# Patient Record
Sex: Male | Born: 1985 | Race: White | Hispanic: No | Smoking: Former smoker
Health system: Southern US, Community
[De-identification: ages and names within clinical notes are randomized; demographics above are authoritative.]

## PROBLEM LIST (undated history)

## (undated) DIAGNOSIS — Z9114 Patient's other noncompliance with medication regimen: Secondary | ICD-10-CM

## (undated) DIAGNOSIS — Z91148 Patient's other noncompliance with medication regimen for other reason: Secondary | ICD-10-CM

## (undated) DIAGNOSIS — R911 Solitary pulmonary nodule: Secondary | ICD-10-CM

## (undated) DIAGNOSIS — N200 Calculus of kidney: Secondary | ICD-10-CM

## (undated) DIAGNOSIS — E669 Obesity, unspecified: Secondary | ICD-10-CM

## (undated) DIAGNOSIS — I10 Essential (primary) hypertension: Secondary | ICD-10-CM

## (undated) DIAGNOSIS — E118 Type 2 diabetes mellitus with unspecified complications: Secondary | ICD-10-CM

## (undated) DIAGNOSIS — E785 Hyperlipidemia, unspecified: Secondary | ICD-10-CM

## (undated) HISTORY — DX: Obesity, unspecified: E66.9

## (undated) HISTORY — DX: Calculus of kidney: N20.0

## (undated) HISTORY — PX: TONSILLECTOMY: SUR1361

## (undated) HISTORY — DX: Hyperlipidemia, unspecified: E78.5

## (undated) HISTORY — DX: Essential (primary) hypertension: I10

## (undated) HISTORY — PX: KIDNEY STONE SURGERY: SHX686

## (undated) HISTORY — DX: Solitary pulmonary nodule: R91.1

---
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
## 2007-10-20 ENCOUNTER — Ambulatory Visit: Payer: Self-pay | Admitting: Pediatrics

## 2011-07-07 LAB — COMPREHENSIVE METABOLIC PANEL
Anion Gap: 12 (ref 7–16)
BUN: 13 mg/dL (ref 7–18)
Bilirubin,Total: 0.3 mg/dL (ref 0.2–1.0)
Calcium, Total: 9.1 mg/dL (ref 8.5–10.1)
Chloride: 106 mmol/L (ref 98–107)
EGFR (African American): >60
Glucose: 148 mg/dL — ABNORMAL HIGH (ref 65–99)
Osmolality: 286 (ref 275–301)
Total Protein: 8.6 g/dL — ABNORMAL HIGH (ref 6.4–8.2)

## 2011-07-07 LAB — DRUG SCREEN, URINE
Barbiturates, Ur Screen: NEGATIVE (ref ?–200)
Benzodiazepine, Ur Scrn: NEGATIVE (ref ?–200)
Cannabinoid 50 Ng, Ur ~~LOC~~: POSITIVE (ref ?–50)
Cocaine Metabolite,Ur ~~LOC~~: NEGATIVE (ref ?–300)
MDMA (Ecstasy)Ur Screen: NEGATIVE (ref ?–500)
Opiate, Ur Screen: NEGATIVE (ref ?–300)
Phencyclidine (PCP) Ur S: NEGATIVE (ref ?–25)

## 2011-07-07 LAB — CBC
HCT: 42.7 % (ref 40.0–52.0)
HGB: 14.2 g/dL (ref 13.0–18.0)
MCH: 28.9 pg (ref 26.0–34.0)
MCHC: 33.2 g/dL (ref 32.0–36.0)
RBC: 4.9 10*6/uL (ref 4.40–5.90)

## 2011-07-07 LAB — ETHANOL: Ethanol %: <0.003 % (ref 0.000–0.080)

## 2011-07-08 ENCOUNTER — Inpatient Hospital Stay: Payer: Self-pay | Admitting: Psychiatry

## 2011-08-20 ENCOUNTER — Emergency Department: Payer: Self-pay | Admitting: *Deleted

## 2011-08-20 LAB — URINALYSIS, COMPLETE
Glucose,UR: NEGATIVE mg/dL (ref 0–75)
Leukocyte Esterase: NEGATIVE
Ph: 6 (ref 4.5–8.0)
RBC,UR: <1 /HPF (ref 0–5)
Specific Gravity: 1.021 (ref 1.003–1.030)
Squamous Epithelial: 1
WBC UR: <1 /HPF (ref 0–5)

## 2011-08-20 LAB — CBC
HGB: 13.2 g/dL (ref 13.0–18.0)
MCV: 87 fL (ref 80–100)
Platelet: 289 10*3/uL (ref 150–440)
RBC: 4.66 10*6/uL (ref 4.40–5.90)
RDW: 14.1 % (ref 11.5–14.5)
WBC: 12.4 10*3/uL — ABNORMAL HIGH (ref 3.8–10.6)

## 2011-08-20 LAB — COMPREHENSIVE METABOLIC PANEL
Albumin: 4.1 g/dL (ref 3.4–5.0)
BUN: 13 mg/dL (ref 7–18)
Bilirubin,Total: 0.7 mg/dL (ref 0.2–1.0)
Calcium, Total: 9.2 mg/dL (ref 8.5–10.1)
Chloride: 104 mmol/L (ref 98–107)
Creatinine: 1.16 mg/dL (ref 0.60–1.30)
Glucose: 138 mg/dL — ABNORMAL HIGH (ref 65–99)
Osmolality: 280 (ref 275–301)
Potassium: 4 mmol/L (ref 3.5–5.1)
SGOT(AST): 23 U/L (ref 15–37)

## 2012-09-21 ENCOUNTER — Emergency Department: Payer: Self-pay | Admitting: Emergency Medicine

## 2012-09-21 LAB — COMPREHENSIVE METABOLIC PANEL
Albumin: 4 g/dL (ref 3.4–5.0)
Alkaline Phosphatase: 135 U/L (ref 50–136)
Anion Gap: 5 — ABNORMAL LOW (ref 7–16)
BUN: 11 mg/dL (ref 7–18)
Bilirubin,Total: 0.6 mg/dL (ref 0.2–1.0)
Creatinine: 1.4 mg/dL — ABNORMAL HIGH (ref 0.60–1.30)
EGFR (African American): >60
EGFR (Non-African Amer.): >60
Glucose: 124 mg/dL — ABNORMAL HIGH (ref 65–99)
Potassium: 3.4 mmol/L — ABNORMAL LOW (ref 3.5–5.1)
SGOT(AST): 39 U/L — ABNORMAL HIGH (ref 15–37)
SGPT (ALT): 68 U/L (ref 12–78)
Total Protein: 7.8 g/dL (ref 6.4–8.2)

## 2012-09-21 LAB — CBC
HCT: 40.4 % (ref 40.0–52.0)
HGB: 13.5 g/dL (ref 13.0–18.0)
MCH: 28.7 pg (ref 26.0–34.0)
MCHC: 33.5 g/dL (ref 32.0–36.0)
Platelet: 331 10*3/uL (ref 150–440)
RBC: 4.72 10*6/uL (ref 4.40–5.90)
RDW: 13.2 % (ref 11.5–14.5)
WBC: 12 10*3/uL — ABNORMAL HIGH (ref 3.8–10.6)

## 2013-02-05 ENCOUNTER — Observation Stay: Payer: Self-pay | Admitting: Internal Medicine

## 2013-02-05 LAB — URINALYSIS, COMPLETE
Bacteria: NONE SEEN
Hyaline Cast: 2
Leukocyte Esterase: NEGATIVE
Ph: 5 (ref 4.5–8.0)
RBC,UR: 1 /HPF (ref 0–5)
Specific Gravity: 1.025 (ref 1.003–1.030)

## 2013-02-05 LAB — BASIC METABOLIC PANEL
Calcium, Total: 9.7 mg/dL (ref 8.5–10.1)
Co2: 23 mmol/L (ref 21–32)
EGFR (African American): >60
EGFR (Non-African Amer.): >60
Sodium: 139 mmol/L (ref 136–145)

## 2013-02-05 LAB — CBC
HGB: 15 g/dL (ref 13.0–18.0)
MCH: 28.9 pg (ref 26.0–34.0)
MCHC: 33.7 g/dL (ref 32.0–36.0)
RDW: 13.3 % (ref 11.5–14.5)
WBC: 18.9 10*3/uL — ABNORMAL HIGH (ref 3.8–10.6)

## 2013-02-05 LAB — HEMOGLOBIN A1C: Hemoglobin A1C: 7.4 % — ABNORMAL HIGH (ref 4.2–6.3)

## 2013-02-05 LAB — DRUG SCREEN, URINE
Amphetamines, Ur Screen: NEGATIVE (ref ?–1000)
Barbiturates, Ur Screen: NEGATIVE (ref ?–200)
Benzodiazepine, Ur Scrn: NEGATIVE (ref ?–200)
Cocaine Metabolite,Ur ~~LOC~~: NEGATIVE (ref ?–300)
MDMA (Ecstasy)Ur Screen: NEGATIVE (ref ?–500)
Methadone, Ur Screen: NEGATIVE (ref ?–300)
Opiate, Ur Screen: NEGATIVE (ref ?–300)
Tricyclic, Ur Screen: NEGATIVE (ref ?–1000)

## 2013-02-06 LAB — BASIC METABOLIC PANEL
Anion Gap: 6 — ABNORMAL LOW (ref 7–16)
BUN: 16 mg/dL (ref 7–18)
Calcium, Total: 8.8 mg/dL (ref 8.5–10.1)
Co2: 27 mmol/L (ref 21–32)
Creatinine: 1.27 mg/dL (ref 0.60–1.30)
EGFR (African American): >60
EGFR (Non-African Amer.): >60
Glucose: 119 mg/dL — ABNORMAL HIGH (ref 65–99)
Osmolality: 282 (ref 275–301)
Sodium: 140 mmol/L (ref 136–145)

## 2013-02-06 LAB — CK TOTAL AND CKMB (NOT AT ARMC)
CK, Total: 302 U/L — ABNORMAL HIGH (ref 35–232)
CK, Total: 326 U/L — ABNORMAL HIGH (ref 35–232)
CK-MB: 2 ng/mL (ref 0.5–3.6)

## 2013-02-06 LAB — TROPONIN I: Troponin-I: 0.06 ng/mL — ABNORMAL HIGH

## 2013-02-06 LAB — LIPID PANEL
Cholesterol: 144 mg/dL (ref 0–200)
Ldl Cholesterol, Calc: 86 mg/dL (ref 0–100)
Triglycerides: 120 mg/dL (ref 0–200)
VLDL Cholesterol, Calc: 24 mg/dL (ref 5–40)

## 2013-05-12 ENCOUNTER — Emergency Department: Payer: Self-pay | Admitting: Emergency Medicine

## 2013-05-12 LAB — COMPREHENSIVE METABOLIC PANEL
ALBUMIN: 4.1 g/dL (ref 3.4–5.0)
ALT: 50 U/L (ref 12–78)
ANION GAP: 5 — AB (ref 7–16)
Alkaline Phosphatase: 122 U/L — ABNORMAL HIGH
BUN: 14 mg/dL (ref 7–18)
Bilirubin,Total: 0.8 mg/dL (ref 0.2–1.0)
CALCIUM: 9.3 mg/dL (ref 8.5–10.1)
CO2: 27 mmol/L (ref 21–32)
CREATININE: 1.27 mg/dL (ref 0.60–1.30)
Chloride: 103 mmol/L (ref 98–107)
Glucose: 174 mg/dL — ABNORMAL HIGH (ref 65–99)
OSMOLALITY: 275 (ref 275–301)
Potassium: 3.9 mmol/L (ref 3.5–5.1)
SGOT(AST): 32 U/L (ref 15–37)
SODIUM: 135 mmol/L — AB (ref 136–145)
TOTAL PROTEIN: 8.3 g/dL — AB (ref 6.4–8.2)

## 2013-05-12 LAB — CBC WITH DIFFERENTIAL/PLATELET
BASOS ABS: 0.1 10*3/uL (ref 0.0–0.1)
Basophil %: 0.9 %
EOS PCT: 1.2 %
Eosinophil #: 0.1 10*3/uL (ref 0.0–0.7)
HCT: 44.3 % (ref 40.0–52.0)
HGB: 14.6 g/dL (ref 13.0–18.0)
LYMPHS ABS: 2.3 10*3/uL (ref 1.0–3.6)
LYMPHS PCT: 18.7 %
MCH: 28.9 pg (ref 26.0–34.0)
MCHC: 32.9 g/dL (ref 32.0–36.0)
MCV: 88 fL (ref 80–100)
MONO ABS: 0.8 x10 3/mm (ref 0.2–1.0)
Monocyte %: 6.2 %
NEUTROS PCT: 73 %
Neutrophil #: 9 10*3/uL — ABNORMAL HIGH (ref 1.4–6.5)
Platelet: 307 10*3/uL (ref 150–440)
RBC: 5.03 10*6/uL (ref 4.40–5.90)
RDW: 13.9 % (ref 11.5–14.5)
WBC: 12.3 10*3/uL — ABNORMAL HIGH (ref 3.8–10.6)

## 2013-05-12 LAB — PROTIME-INR
INR: 1
Prothrombin Time: 12.6 secs (ref 11.5–14.7)

## 2013-05-20 ENCOUNTER — Emergency Department: Payer: Self-pay | Admitting: Emergency Medicine

## 2013-05-20 LAB — TROPONIN I
Troponin-I: <0.02 ng/mL
Troponin-I: <0.02 ng/mL

## 2013-05-20 LAB — BASIC METABOLIC PANEL
ANION GAP: 6 — AB (ref 7–16)
BUN: 12 mg/dL (ref 7–18)
CO2: 25 mmol/L (ref 21–32)
Calcium, Total: 8.7 mg/dL (ref 8.5–10.1)
Chloride: 106 mmol/L (ref 98–107)
Creatinine: 1.3 mg/dL (ref 0.60–1.30)
EGFR (African American): >60
Glucose: 106 mg/dL — ABNORMAL HIGH (ref 65–99)
OSMOLALITY: 274 (ref 275–301)
POTASSIUM: 3.7 mmol/L (ref 3.5–5.1)
Sodium: 137 mmol/L (ref 136–145)

## 2013-05-20 LAB — CBC
HCT: 42 % (ref 40.0–52.0)
HGB: 13.8 g/dL (ref 13.0–18.0)
MCH: 29.2 pg (ref 26.0–34.0)
MCHC: 32.8 g/dL (ref 32.0–36.0)
MCV: 89 fL (ref 80–100)
Platelet: 290 10*3/uL (ref 150–440)
RBC: 4.72 10*6/uL (ref 4.40–5.90)
RDW: 13.8 % (ref 11.5–14.5)
WBC: 13.2 10*3/uL — ABNORMAL HIGH (ref 3.8–10.6)

## 2013-05-22 ENCOUNTER — Encounter: Payer: Self-pay | Admitting: *Deleted

## 2013-05-23 ENCOUNTER — Encounter: Payer: Self-pay | Admitting: Cardiovascular Disease

## 2013-05-23 ENCOUNTER — Ambulatory Visit (INDEPENDENT_AMBULATORY_CARE_PROVIDER_SITE_OTHER): Payer: Self-pay | Admitting: Cardiovascular Disease

## 2013-05-23 VITALS — BP 168/112 | HR 102 | Ht 70.0 in | Wt 316.2 lb

## 2013-05-23 VITALS — BP 154/112 | HR 93 | Ht 70.0 in | Wt 316.2 lb

## 2013-05-23 DIAGNOSIS — R0602 Shortness of breath: Secondary | ICD-10-CM

## 2013-05-23 DIAGNOSIS — I10 Essential (primary) hypertension: Secondary | ICD-10-CM | POA: Insufficient documentation

## 2013-05-23 DIAGNOSIS — R079 Chest pain, unspecified: Secondary | ICD-10-CM

## 2013-05-23 MED ORDER — CARVEDILOL 12.5 MG PO TABS: 12.5000 mg | ORAL_TABLET | Freq: Two times a day (BID) | ORAL | Status: DC

## 2013-05-23 MED ORDER — LISINOPRIL-HYDROCHLOROTHIAZIDE 20-25 MG PO TABS
1.0000 | ORAL_TABLET | Freq: Two times a day (BID) | ORAL | Status: DC
Start: 2013-05-23 — End: 2014-12-04

## 2013-05-23 NOTE — Progress Notes (Signed)
HPI  This is a 28 year old Caucasian male who was referred from the emergency room at [facility] for evaluation of chest pain and dyspnea. He has no previous cardiac history. He has known history of uncontrolled hypertension since he was in high school, obesity, recent diagnosis of diabetes, previous tobacco use and suspected sleep apnea. He was hospitalized briefly at [facility] in November of 2014 with chest pain and shortness of breath. He was very hypertensive with a blood pressure of 215/127. He also reported hemoptysis. He underwent CTA of the chest which showed no evidence of pulmonary embolism. There was incidental finding of a 5 mm right lung nodule. Cardiac enzymes were negative. He was placed on antihypertensive medications and discharged home. He went back to the emergency room recently for chest pain and thus he was asked to followup with [place]. He reports chest discomfort both at rest and occasionally with physical activities. It's described as sharp pain and occasionally tightness. He gets extreme dyspnea with activities but he does not exercise on a regular basis. He reports presyncopal episodes with palpitations after having sex. He quit smoking 2 months ago. He snores loudly at night and occasionally wakes up in the middle of the night with difficulty breathing. 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 He has not been able to afford  health insurance. There is family history of hypertension and congestive heart failure but not premature coronary artery disease.   No Known Allergies   No current outpatient prescriptions on file prior to visit.   No current facility-administered medications on file prior to visit.     Past Medical History  Diagnosis Date  . Obesity   . Kidney stones   . Diabetes mellitus without complication   . Hyperlipidemia   . Lung nodule   . Hypertension      Past Surgical History  Procedure Laterality Date  . Kidney stone surgery       Family  History  Problem Relation Age of Onset  . Heart failure Mother   . Hypertension Mother   . Brain cancer Father      History   Social History  . Marital Status: Legally Separated    Spouse Name: N/A    Number of Children: N/A  . Years of Education: N/A   Occupational History  . Not on file.   Social History Main Topics  . Smoking status: Former Smoker -- 0.50 packs/day for 1 years    Types: Cigarettes  . Smokeless tobacco: Not on file  . Alcohol Use: Yes     Comment: occasional  . Drug Use: Yes    Special: Marijuana  . Sexual Activity: Not on file   Other Topics Concern  . Not on file   Social History Narrative  . No narrative on file     ROS A 10 point review of system was performed. It is negative other than that mentioned in the history of present illness.   PHYSICAL EXAM   BP 154/112  Pulse 93  Ht 5\' 10"  (1.778 m)  Wt 316 lb 4 oz (143.45 kg)  BMI 45.38 kg/m2 Constitutional: He is oriented to person, place, and time. He appears well-developed and well-nourished. No distress.  HENT: No nasal discharge.  Head: Normocephalic and atraumatic.  Eyes: Pupils are equal and round.  No discharge. Neck: Normal range of motion. Neck supple. No JVD present. No thyromegaly present.  Cardiovascular: Normal rate, regular rhythm, normal heart sounds. Exam reveals  no gallop and no friction rub. No murmur heard.  Pulmonary/Chest: Effort normal and breath sounds normal. No stridor. No respiratory distress. He has no wheezes. He has no rales. He exhibits no tenderness.  Abdominal: Soft. Bowel sounds are normal. He exhibits no distension. There is no tenderness. There is no rebound and no guarding.  Musculoskeletal: Normal range of motion. He exhibits no edema and no tenderness.  Neurological: He is alert and oriented to person, place, and time. Coordination normal.  Skin: Skin is warm and dry. No rash noted. He is not diaphoretic. No erythema. No pallor.  Psychiatric: He has  a normal mood and affect. His behavior is normal. Judgment and thought content normal.       WUX:LKGMWEKG:Sinus  Rhythm  WITHIN NORMAL LIMITS   ASSESSMENT AND PLAN

## 2013-05-23 NOTE — Patient Instructions (Signed)
Your stress test was normal.   Make the following changes to your medications: 1. Stop Hydralazine.  2. Start Carvedilol 12.5 mg twice daily.  3. Change Lisinopril-HCTZ to 20-12.5 mg twice daily.   You need to establish with a family doctor.   Follow up in 2 months.

## 2013-05-23 NOTE — Procedures (Signed)
   Treadmill Stress test  Indication: Chest pain and dyspnea.  Baseline Data:  Resting EKG shows NSR with rate of 102 bpm, nonspecific T wave changes Resting blood pressure of 168/112 mm Hg Stand bruce protocal was used.  Exercise Data:  Patient exercised for 5 min 29 sec,  Peak heart rate of 163 bpm.  This was 84 % of the maximum predicted heart rate. The patient had right-sided chest pain and significant dyspnea with exercise. This resolved in recovery. Peak Blood pressure recorded was 240/104 Maximal work level: 7 METs.  Heart rate at 3 minutes in recovery was 108 bpm. BP response: Hypertensive HR response: Normal  EKG with Exercise: Sinus tachycardia with no significant ST changes  FINAL IMPRESSION: Normal exercise stress test. No significant EKG changes concerning for ischemia. Poor exercise tolerance for age with hypertensive response to exercise.  Recommendation: Aggressive lifestyle changes, exercise program, blood pressure control and weight loss.

## 2013-05-23 NOTE — Assessment & Plan Note (Signed)
This is likely multifactorial due to physical deconditioning, uncontrolled hypertension, diastolic dysfunction and possible underlying sleep apnea. We will work on blood pressure control. I advised him to establish with a primary care physician. I discussed with him the importance of regular exercise, healthy diet and weight loss. An echocardiogram can be considered if symptoms do not improve.

## 2013-05-23 NOTE — Assessment & Plan Note (Signed)
The chest pain is somewhat atypical but he has associated exertional dyspnea. His risk factors for coronary artery disease include hypertension, diabetes and obesity. Given his young age, the chance of obstructive coronary artery disease is low. However, given his recurrent presentation with chest pain, I decided to proceed with a treadmill stress test. He was able to exercise for only 5-1/2 minutes. He had hypertensive response to exercise. There was no significant ST changes. I recommend aggressive lifestyle changes in blood pressure control. This was discussed extensively with.

## 2013-05-23 NOTE — Patient Instructions (Signed)
Your physician has requested that you have an exercise tolerance test. For further information please visit [email or web address]. Please also follow instruction sheet, as given.   

## 2013-05-23 NOTE — Assessment & Plan Note (Signed)
Blood pressure is still not controlled. Given his complaints of palpitations, I discontinued hydralazine which can cause reflexive tachycardia and switch him to carvedilol instead. I also increased the dose of lisinopril-hydrochlorothiazide to twice daily. Check basic metabolic profile in one week.

## 2013-05-24 ENCOUNTER — Ambulatory Visit: Payer: Self-pay | Admitting: Oncology

## 2013-05-24 ENCOUNTER — Telehealth: Payer: Self-pay | Admitting: *Deleted

## 2013-05-24 DIAGNOSIS — I10 Essential (primary) hypertension: Secondary | ICD-10-CM

## 2013-05-24 NOTE — Telephone Encounter (Signed)
Message copied by Fransico SettersBAUCOM, Nirali Magouirk E on Fri May 24, 2013  8:21 AM ------      Message from: Lorine BearsARIDA, MUHAMMAD A      Created: Thu May 23, 2013  6:36 PM       Order basic metabolic profile in one week given that we increased the dose of lisinopril-hydrochlorothiazide. Thanks ------

## 2013-05-30 ENCOUNTER — Ambulatory Visit (INDEPENDENT_AMBULATORY_CARE_PROVIDER_SITE_OTHER): Payer: Self-pay | Admitting: *Deleted

## 2013-05-30 DIAGNOSIS — I10 Essential (primary) hypertension: Secondary | ICD-10-CM

## 2013-05-31 LAB — BASIC METABOLIC PANEL
BUN/Creatinine Ratio: 10 (ref 8–19)
BUN: 14 mg/dL (ref 6–20)
CALCIUM: 9.4 mg/dL (ref 8.7–10.2)
CHLORIDE: 100 mmol/L (ref 97–108)
CO2: 20 mmol/L (ref 18–29)
Creatinine, Ser: 1.35 mg/dL — ABNORMAL HIGH (ref 0.76–1.27)
GFR calc non Af Amer: 72 mL/min/{1.73_m2} (ref >59)
GFR, EST AFRICAN AMERICAN: 83 mL/min/{1.73_m2} (ref >59)
Glucose: 177 mg/dL — ABNORMAL HIGH (ref 65–99)
Potassium: 4.4 mmol/L (ref 3.5–5.2)
Sodium: 140 mmol/L (ref 134–144)

## 2013-06-12 ENCOUNTER — Ambulatory Visit: Payer: Self-pay | Admitting: Oncology

## 2013-07-23 ENCOUNTER — Encounter: Payer: Self-pay | Admitting: *Deleted

## 2013-07-23 ENCOUNTER — Ambulatory Visit: Payer: Self-pay | Admitting: Cardiovascular Disease

## 2014-04-10 ENCOUNTER — Emergency Department: Payer: Self-pay | Admitting: Emergency Medicine

## 2014-04-10 LAB — BASIC METABOLIC PANEL
Anion Gap: 6 — ABNORMAL LOW (ref 7–16)
BUN: 14 mg/dL (ref 7–18)
Calcium, Total: 9.4 mg/dL (ref 8.5–10.1)
Chloride: 105 mmol/L (ref 98–107)
Co2: 26 mmol/L (ref 21–32)
Creatinine: 1.4 mg/dL — ABNORMAL HIGH (ref 0.60–1.30)
EGFR (African American): >60
EGFR (Non-African Amer.): >60
Glucose: 151 mg/dL — ABNORMAL HIGH (ref 65–99)
Osmolality: 277 (ref 275–301)
Potassium: 3.8 mmol/L (ref 3.5–5.1)
Sodium: 137 mmol/L (ref 136–145)

## 2014-04-10 LAB — TROPONIN I

## 2014-07-04 NOTE — H&P (Signed)
PATIENT NAME:  Carlos [name], Carlos [name] MR#:  [id number] DATE OF BIRTH:  March 29, 1985  DATE OF ADMISSION:  02/05/2013  REFERRING PHYSICIAN:  Dr. Scotty CourtStafford.   PRIMARY CARE PHYSICIAN:  None.   CHIEF COMPLAINT:  Chest pain.   HISTORY OF PRESENT ILLNESS:  A 29 year old Caucasian gentleman with past medical history of hypertension, noncompliant with medications, presenting with chest pain.  The chest pain began acutely today, retrosternal in nature, sharp, 10 out of 10 in intensity, radiating to the left shoulder and neck associated with shortness of breath, nausea and diaphoresis.  Symptoms began approximately at 6:00 p.m., relieved in the Emergency Department with nitroglycerin, now states that his chest pain is 1 to 2 out of 10 in intensity and rates the quality as soreness.  On arrival, he was markedly hypertensive at 215/127 which has now trended down after receiving three doses of nitroglycerin to 166/94.  Of note, he also complains of having a cough which is nonproductive for approximately one week in duration.  He now states that he thinks that he has some spots of blood when he is coughing.   REVIEW OF SYSTEMS:   Currently, complaining of mild chest soreness as described above.  Otherwise no further complaints.   CONSTITUTIONAL:  Denies fever, fatigue, weakness, pain.  EYES:  Blurred vision, double vision, eye pain.  EARS, NOSE, THROAT:  Denies tinnitus, ear pain, hearing loss.  RESPIRATORY:  Positive for cough as described above as well as questionable hemoptysis.  Denies any wheeze, shortness of breath.  CARDIOVASCULAR:  Positive for chest pain as described above.  Denies any orthopnea, edema or palpitations.  GASTROINTESTINAL:  Denies nausea, vomiting, diarrhea, abdominal pain.  GENITOURINARY:  No dysuria, hematuria.  ENDOCRINE:  Denies nocturia or polyuria.  HEMATOLOGIC AND LYMPHATIC:  Denies easy bruising or bleeding.  SKIN:  Denies any rash or lesions.  MUSCULOSKELETAL:  Denies pain in neck,  back, shoulder, knees, hips, any arthritic symptoms.  NEUROLOGIC:  Denies any paralysis, paresthesias.  PSYCHIATRIC:  Denies anxiety or depressive symptoms.  Otherwise, full review of systems performed by me is negative.   PAST MEDICAL HISTORY:  Hypertension and obesity.  He is on no medications.   FAMILY HISTORY:  Positive for diabetes, hypertension, coronary artery disease.   SOCIAL HISTORY:  Recent tobacco usage.  He states that he quit about one week ago.  Occasional alcohol usage.  Denies any drug usage.   ALLERGIES:  No known drug allergies.   HOME MEDICATIONS:  None.   PHYSICAL EXAMINATION: VITAL SIGNS:  Temperature 98.4, heart rate 115, respirations 26, blood pressure on arrival 215/127, saturating 97% on room air.  Current blood pressure 166/94, heart rate of 96.  Weight 136.1 kg.  GENERAL:  Well-nourished, well-developed, obese Caucasian gentleman.  No acute distress.  HEAD:  Normocephalic, atraumatic.  EYES:  Pupils equal, round, and reactive to light.  Extraocular muscles intact.  No scleral icterus.  MOUTH:  Moist mucosal membranes.  Dentition intact.  No abscess noted.   EARS, NOSE, THROAT:  Throat clear without exudates.  No external lesions.  NECK:  Supple.  No thyromegaly.  No nodules.  No JVD.  PULMONARY:  Clear to auscultation bilaterally.  No wheezes, rales or rhonchi.  Somewhat diminished breath sounds at bases secondary to body habitus.  No use of accessory muscles.  Good respiratory effort.  Chest mildly tender to palpation over sternum.  CARDIOVASCULAR:  S1, S2, regular rate and rhythm.  No murmurs, rubs or gallops.  No edema.  Pedal pulses 2+ bilaterally.   GASTROINTESTINAL:  Soft, nontender, nondistended.  No masses.  Positive bowel sounds.  No hepatosplenomegaly.  Obese.  MUSCULOSKELETAL:  No swelling, clubbing or edema.  Range of motion full in all extremities.  NEUROLOGIC:  Cranial nerves II through XII intact.  No gross focal neurological deficits.  Sensation  intact.  Reflexes intact.  SKIN:  No ulcerations, lesions, rashes or cyanosis.  Skin warm, dry.  Turgor intact.  PSYCHIATRIC:  Mood and affect within normal limits.  The patient awake, alert, oriented x 3.  Insight and judgment intact.   LABORATORY DATA:  Sodium 139, potassium 3.8, chloride 105, bicarb 23, BUN 19, creatinine 1.47, glucose 148.  Troponin I 0.08.  WBC 18.9, hemoglobin 15, platelets 327.   Chest x-ray, no acute cardiopulmonary process, shallow lung volumes, somewhat obscured by body habitus.  EKG reveals sinus tachycardia at 115.  No evidence of ST or T wave abnormalities.   ASSESSMENT AND PLAN:  A 29 year old Caucasian gentleman with history of hypertension who is noncompliant with medications, presenting with acute onset of chest pain with associated shortness of breath, nausea, diaphoresis, also stating he has been having hemoptysis for approximately one week in total.    1.  Chest pain described as typical pattern, though extremely unlikely to have coronary artery disease at this age.  We will check a urine drug screen as cocaine may likely play a role.  He has been given aspirin, nitroglycerin with relief of symptoms.  We will admit to observation.  Trend cardiac enzymes.  if trending up will start a heparin drip.  2.  Hemoptysis.  No episodes in the Emergency Department.  No indication for transfusion.  His Well's score is 1, meaning low probability for pulmonary embolus.  We will check a [name]-dimer.  3.  Hyperglycemia.  We will add insulin sliding scale and Accu-Cheks q. 6 hours.  We will check hemoglobin A1c as he may have underlying diabetes.  4.  Acute kidney injury.  IV fluids hydration with normal saline.  5.  Leukocytosis with no evidence of infection.  We will hold off on antibiotics.  6.  DVT prophylaxis with heparin subQ.  7.  CODE STATUS:  THE PATIENT IS A FULL CODE.   TIME SPENT:  45 minutes.    ____________________________ Cletis Athens. Hower, MD dkh:ea [name]: 02/05/2013  22:46:25 ET T: 02/05/2013 23:01:58 ET JOB#: [id number]  cc: Cletis Athens. Hower, MD, <Dictator> DAVID Synetta Shadow MD ELECTRONICALLY SIGNED 02/05/2013 23:26

## 2014-07-04 NOTE — Discharge Summary (Signed)
PATIENT NAME:  Carlos ShepherdJEFFREY, Anish [name] MR#:  [id number] DATE OF BIRTH:  1985/12/29  DATE OF ADMISSION:  02/05/2013 DATE OF DISCHARGE:  02/06/2013  DISCHARGE DIAGNOSES:  1. Accelerated hypertension.  2. Rhabdomyolysis with minimal elevation of troponin.  3. New diagnosis of diabetes mellitus type 2.  4. Tobacco abuse.  5. Obesity.  6. Noncompliance.   IMAGING STUDIES DONE: Include chest x-ray, portable, which showed no acute disease.   ADMITTING HISTORY AND PHYSICAL: Please see detailed H and P dictated by Dr. Clint GuyHower. In brief, a 29 year old male patient with history of hypertension, who stopped taking medications 3 months prior, who presented to the hospital with some cough, shortness of breath and chest pain. The patient was also found to have elevated blood pressure in the 200s and admitted to the hospitalist service for further management. The patient's blood pressure was 226/130 at the time of admission.   HOSPITAL COURSE:  1. Chest pain. This was pleuritic secondary to his smoking and bronchitis. The patient had mild wheezing for which he will be discharged home on a prednisone taper of 1 week. I have counseled the patient to quit smoking for greater than 3 minutes on the day of discharge, and he verbalized understanding.  2. Accelerated hypertension. The patient had stopped taking his lisinopril/hydrochlorothiazide 3 months prior. He has been restarted on these medications along with hydralazine. With this, his blood pressure is much improved prior to discharge and was at 134/83. I have explained to him that all his medications are on [facility] $4 list, which he should be compliant with and follow up with his doctor.  3. New-onset diabetes mellitus type 2. The patient will be started on metformin. Advised on lifestyle changes. Had diabetes education in the hospital.   Prior to discharge, the patient had no chest pain, breathing well, ambulated on his own and will be discharged in a fair condition.    DISCHARGE MEDICATIONS: Include:  1. Lisinopril/hydrochlorothiazide 20/25 one tablet oral once a day.  2. Metformin 1000 mg oral b.i.[name].  3. Aspirin 81 mg daily.  4. Prednisone 60 mg tapered over 6 days.  5. Hydralazine 50 mg oral 3 times a day.   DISCHARGE INSTRUCTIONS: Low-sodium, low-fat, carbohydrate-controlled diet. Activity as tolerated. Follow up with primary care physician in 1 to 2 weeks and quit smoking.  TIME SPENT ON DAY OF DISCHARGE IN DISCHARGE ACTIVITY: 35 minutes.   ____________________________ Molinda BailiffSrikar R. Shivon Hackel, MD srs:lb [name]: 02/08/2013 11:20:57 ET T: 02/08/2013 11:33:56 ET JOB#: [id number]  cc: Wardell HeathSrikar R. Markez Dowland, MD, <Dictator> Orie FishermanSRIKAR R Nyasia Baxley MD ELECTRONICALLY SIGNED 02/11/2013 1:42
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
## 2014-07-06 NOTE — Discharge Summary (Signed)
PATIENT NAME:  Carlos ShepherdJEFFREY, [name] MR#:  [id number] DATE OF BIRTH:  05/24/1985  DATE OF ADMISSION:  07/08/2011 DATE OF DISCHARGE:  07/12/2011  HOSPITAL COURSE: See dictated History and Physical for details of admission. This 29 year old man came to the hospital reporting visual hallucinations and auditory hallucinations, disorganized thinking, paranoia, thoughts of suicide, feelings like some vision that he was seeing was going to kill him. He was very agitated and distressed and was focused on the problems he was having with his wife. He was so agitated that initially he needed to stay in the Emergency Room for a day to decrease his risk of violence on the Ward. He was started on Navane and tolerated this medicine well and was compliant with it. In addition, he has high blood pressure which was discovered in the Emergency Room to be quite high and has required somewhat aggressive treatment with medication but has shown response. Since being on the Unit, the patient has not engaged in any dangerous behavior. He has now stated for the last couple of days that his mood has improved. He is no longer having auditory or visual hallucinations. He denies any wish to die. The turning point appears to be not only getting on the medicine, and getting some sleep, but confirming that he and his wife were separating and that he would be able to stay with his mother. He thinks this will be a much more stable situation for him. He has been able to articulate an appropriate plan for taking care of himself into the future. He is completely agreeable to going to outpatient Mental Health treatment.   DISCHARGE MEDICATIONS:  1. Navane 2 mg t.i.d.  2. Zestril 10 mg per day.  3. Trazodone 100 mg at night as needed for sleep.  4. Melatonin 3 mg at night.  5. Hydrochlorothiazide 50 mg per day. 6. Cogentin 1 mg every 6 hours p.r.n. for EPS.   LABORATORY DATA: The drug screen was positive for cannabis. TSH was normal. Alcohol was  undetectable. Creatinine was elevated at 1.3, glucose elevated at 148, protein elevated at 8.6. CBC showed a white count elevated at 16.3. Salicylates were slightly elevated at 3.1.   MENTAL STATUS EXAM: Neatly dressed and groomed. Cooperative with exam. Does not appear to be in any acute physical distress. Good eye contact, normal psychomotor activity. Speech normal rate, tone, and pattern. Affect is euthymic, appropriate, reactive. No tearfulness. Not feeling frightened. Totally denies any auditory or visual hallucinations and does not behave as though he were responding to internal stimuli. Thoughts appear to be lucid and directed. Insight and judgment are improved.   DISPOSITION: Discharged to his mother's house. [facility].   DIAGNOSIS, PRINCIPAL AND PRIMARY:  AXIS I: Psychosis, not otherwise specified.   SECONDARY DIAGNOSES:  AXIS I: Marijuana abuse.   AXIS II: Deferred.   AXIS III: High blood pressure.   AXIS IV: Moderate-to-severe stress from break-up with his wife.   AXIS V: Functioning at time of discharge 57.   ____________________________ Audery AmelJohn T. Clapacs, MD jtc:cbb D: 07/12/2011 12:37:23 ET T: 07/12/2011 13:34:46 ET JOB#: [id number]  cc: Audery AmelJohn T. Clapacs, MD, <Dictator> Audery AmelJOHN T CLAPACS MD ELECTRONICALLY SIGNED 07/13/2011 17:05

## 2014-08-25 ENCOUNTER — Emergency Department
Admission: EM | Admit: 2014-08-25 | Discharge: 2014-08-25 | Disposition: A | Discharge status: Home / Self Care | Payer: Self-pay | Attending: Emergency Medicine | Admitting: Emergency Medicine

## 2014-08-25 ENCOUNTER — Emergency Department: Payer: Self-pay

## 2014-08-25 ENCOUNTER — Encounter: Payer: Self-pay | Admitting: *Deleted

## 2014-08-25 DIAGNOSIS — Z87891 Personal history of nicotine dependence: Secondary | ICD-10-CM | POA: Insufficient documentation

## 2014-08-25 DIAGNOSIS — N410 Acute prostatitis: Secondary | ICD-10-CM | POA: Insufficient documentation

## 2014-08-25 DIAGNOSIS — E119 Type 2 diabetes mellitus without complications: Secondary | ICD-10-CM | POA: Insufficient documentation

## 2014-08-25 DIAGNOSIS — Z7982 Long term (current) use of aspirin: Secondary | ICD-10-CM | POA: Insufficient documentation

## 2014-08-25 DIAGNOSIS — Z79899 Other long term (current) drug therapy: Secondary | ICD-10-CM | POA: Insufficient documentation

## 2014-08-25 DIAGNOSIS — A64 Unspecified sexually transmitted disease: Secondary | ICD-10-CM | POA: Insufficient documentation

## 2014-08-25 DIAGNOSIS — I10 Essential (primary) hypertension: Secondary | ICD-10-CM | POA: Insufficient documentation

## 2014-08-25 DIAGNOSIS — R361 Hematospermia: Secondary | ICD-10-CM | POA: Insufficient documentation

## 2014-08-25 LAB — COMPREHENSIVE METABOLIC PANEL
ALK PHOS: 95 U/L (ref 38–126)
ALT: 30 U/L (ref 17–63)
AST: 26 U/L (ref 15–41)
Albumin: 4.1 g/dL (ref 3.5–5.0)
Anion gap: 8 (ref 5–15)
BILIRUBIN TOTAL: 0.4 mg/dL (ref 0.3–1.2)
BUN: 14 mg/dL (ref 6–20)
CALCIUM: 8.9 mg/dL (ref 8.9–10.3)
CO2: 26 mmol/L (ref 22–32)
Chloride: 106 mmol/L (ref 101–111)
Creatinine, Ser: 1.36 mg/dL — ABNORMAL HIGH (ref 0.61–1.24)
GFR calc Af Amer: >60 mL/min (ref >60)
GFR calc non Af Amer: >60 mL/min (ref >60)
GLUCOSE: 195 mg/dL — AB (ref 65–99)
Potassium: 3.6 mmol/L (ref 3.5–5.1)
Sodium: 140 mmol/L (ref 135–145)
Total Protein: 7.5 g/dL (ref 6.5–8.1)

## 2014-08-25 LAB — URINALYSIS COMPLETE WITH MICROSCOPIC (ARMC ONLY)
BILIRUBIN URINE: NEGATIVE
Bacteria, UA: NONE SEEN
Glucose, UA: 50 mg/dL — AB
Hgb urine dipstick: NEGATIVE
Ketones, ur: NEGATIVE mg/dL
LEUKOCYTES UA: NEGATIVE
Nitrite: NEGATIVE
Protein, ur: NEGATIVE mg/dL
SPECIFIC GRAVITY, URINE: 1.013 (ref 1.005–1.030)
pH: 7 (ref 5.0–8.0)

## 2014-08-25 LAB — CBC WITH DIFFERENTIAL/PLATELET
BASOS ABS: 0.1 10*3/uL (ref 0–0.1)
Basophils Relative: 1 %
Eosinophils Absolute: 0.4 10*3/uL (ref 0–0.7)
Eosinophils Relative: 3 %
HCT: 41.1 % (ref 40.0–52.0)
HEMOGLOBIN: 13.3 g/dL (ref 13.0–18.0)
Lymphocytes Relative: 19 %
Lymphs Abs: 2.4 10*3/uL (ref 1.0–3.6)
MCH: 28.7 pg (ref 26.0–34.0)
MCHC: 32.4 g/dL (ref 32.0–36.0)
MCV: 88.5 fL (ref 80.0–100.0)
MONOS PCT: 6 %
Monocytes Absolute: 0.7 10*3/uL (ref 0.2–1.0)
NEUTROS ABS: 8.9 10*3/uL — AB (ref 1.4–6.5)
NEUTROS PCT: 71 %
Platelets: 267 10*3/uL (ref 150–440)
RBC: 4.65 MIL/uL (ref 4.40–5.90)
RDW: 13.6 % (ref 11.5–14.5)
WBC: 12.4 10*3/uL — ABNORMAL HIGH (ref 3.8–10.6)

## 2014-08-25 LAB — CHLAMYDIA/NGC RT PCR (ARMC ONLY)
Chlamydia Tr: NOT DETECTED
N GONORRHOEAE: NOT DETECTED

## 2014-08-25 MED ORDER — CLONIDINE HCL 0.1 MG PO TABS
0.1000 mg | ORAL_TABLET | Freq: Once | ORAL | Status: AC
  Administered 2014-08-25: 0.1 mg via ORAL

## 2014-08-25 MED ORDER — DOXYCYCLINE MONOHYDRATE 100 MG PO CAPS: 100.0000 mg | ORAL_CAPSULE | Freq: Two times a day (BID) | ORAL | Status: AC

## 2014-08-25 MED ORDER — AZITHROMYCIN 250 MG PO TABS
1000.0000 mg | ORAL_TABLET | Freq: Once | ORAL | Status: AC
  Administered 2014-08-25: 1000 mg via ORAL

## 2014-08-25 MED ORDER — AZITHROMYCIN 250 MG PO TABS
ORAL_TABLET | ORAL | Status: AC
  Administered 2014-08-25: 1000 mg via ORAL
  Filled 2014-08-25: qty 1

## 2014-08-25 MED ORDER — CARVEDILOL 25 MG PO TABS
12.5000 mg | ORAL_TABLET | Freq: Once | ORAL | Status: AC
Start: 2014-08-25 — End: 2014-08-25
  Administered 2014-08-25: 12.5 mg via ORAL

## 2014-08-25 MED ORDER — LIDOCAINE HCL (PF) 1 % IJ SOLN
INTRAMUSCULAR | Status: AC
  Filled 2014-08-25: qty 5

## 2014-08-25 MED ORDER — CARVEDILOL 25 MG PO TABS
ORAL_TABLET | ORAL | Status: AC
  Administered 2014-08-25: 12.5 mg via ORAL
  Filled 2014-08-25: qty 1

## 2014-08-25 MED ORDER — SODIUM CHLORIDE 0.9 % IV SOLN
Freq: Once | INTRAVENOUS | Status: AC
  Administered 2014-08-25: 10:00:00 via INTRAVENOUS

## 2014-08-25 MED ORDER — CLONIDINE HCL 0.1 MG PO TABS
ORAL_TABLET | ORAL | Status: AC
  Administered 2014-08-25: 0.1 mg via ORAL
  Filled 2014-08-25: qty 1

## 2014-08-25 MED ORDER — CEFTRIAXONE SODIUM 250 MG IJ SOLR
INTRAMUSCULAR | Status: AC
  Administered 2014-08-25: 250 mg via INTRAMUSCULAR
  Filled 2014-08-25: qty 250

## 2014-08-25 MED ORDER — CEFTRIAXONE SODIUM 250 MG IJ SOLR
250.0000 mg | Freq: Once | INTRAMUSCULAR | Status: AC
  Administered 2014-08-25: 250 mg via INTRAMUSCULAR

## 2014-08-25 NOTE — Discharge Instructions (Signed)
Take medication as prescribed. As discussed is very important to take your home blood pressure medicines as prescribed. practice safe sex and use protection. Use sunscreen outside and no prolong sun exposure as antibiotic can make you sensitive to light.  It is very important to follow up with your primary care physician this week. Follow-up with your primary physician in regards to blood pressure management. Monitor your blood pressure at home and keep a diary.  Also follow-up with [facility] Department see above as well as the handout.  Return to the ER for fever, abdominal pain, difficulty urinating, new or worsening concerns.  Prostatitis Prostatitis is redness, soreness, and puffiness (swelling) of the prostate gland. The prostate gland is the walnut-sized gland located just below your bladder. HOME CARE:   Take all medicines as told by your doctor.  Take warm-water baths (sitz baths) as told by your doctor. GET HELP IF:  Your symptoms get worse, not better.  You have a fever. GET HELP RIGHT AWAY IF:   You have chills.  You feel sick to your stomach (nauseous) or like you will throw up (vomit).  You feel lightheaded or like you will pass out (faint).  You are unable to pee (urinate).  You have blood or blood clumps (clots) in your pee (urine). MAKE SURE YOU:  Understand these instructions.  Will watch your condition.  Will get help right away if you are not doing well or get worse. Document Released: 08/30/2011 Document Revised: 10/31/2012 Document Reviewed: 09/17/2012 [facility] Patient Information 2015 [place], [place]. This information is not intended to replace advice given to you by your health care provider. Make sure you discuss any questions you have with your health care provider.  Sexually Transmitted Disease A sexually transmitted disease (STD) is a disease or infection often passed to another person during sex. However, STDs can be passed through  nonsexual ways. An STD can be passed through:  Spit (saliva).  Semen.  Blood.  Mucus from the vagina.  Pee (urine). HOW CAN I LESSEN MY CHANCES OF GETTING AN STD?  Use:  Latex condoms.  Water-soluble lubricants with condoms. Do not use petroleum jelly or oils.  Dental dams. These are small pieces of latex that are used as a barrier during oral sex.  Avoid having more than one sex partner.  Do not have sex with someone who has other sex partners.  Do not have sex with anyone you do not know or who is at high risk for an STD.  Avoid risky sex that can break your skin.  Do not have sex if you have open sores on your mouth or skin.  Avoid drinking too much alcohol or taking illegal drugs. Alcohol and drugs can affect your good judgment.  Avoid oral and anal sex acts.  Get shots (vaccines) for HPV and hepatitis.  If you are at risk of being infected with HIV, it is advised that you take a certain medicine daily to prevent HIV infection. This is called pre-exposure prophylaxis (PrEP). You may be at risk if:  You are a man who has sex with other men (MSM).  You are attracted to the opposite sex (heterosexual) and are having sex with more than one partner.  You take drugs with a needle.  You have sex with someone who has HIV.  Talk with your doctor about if you are at high risk of being infected with HIV. If you begin to take PrEP, get tested for HIV first. Get  tested every 3 months for as long as you are taking PrEP. WHAT SHOULD I DO IF I THINK I HAVE AN STD?  See your doctor.  Tell your sex partner(s) that you have an STD. They should be tested and treated.  Do not have sex until your doctor says it is okay. WHEN SHOULD I GET HELP? Get help right away if:  You have bad belly (abdominal) pain.  You are a man and have puffiness (swelling) or pain in your testicles.  You are a woman and have puffiness in your vagina. Document Released: 04/07/2004 Document  Revised: 03/05/2013 Document Reviewed: 08/24/2012 [facility] Patient Information 2015 [place], [place]. This information is not intended to replace advice given to you by your health care provider. Make sure you discuss any questions you have with your health care provider.  Prostatitis The prostate gland is about the size and shape of a walnut. It is located just below your bladder. It produces one of the components of semen, which is made up of sperm and the fluids that help nourish and transport it out from the testicles. Prostatitis is inflammation of the prostate gland.  There are four types of prostatitis:  Acute bacterial prostatitis. This is the least common type of prostatitis. It starts quickly and usually is associated with a bladder infection, high fever, and shaking chills. It can occur at any age.  Chronic bacterial prostatitis. This is a persistent bacterial infection in the prostate. It usually develops from repeated acute bacterial prostatitis or acute bacterial prostatitis that was not properly treated. It can occur in men of any age but is most common in middle-aged men whose prostate has begun to enlarge. The symptoms are not as severe as those in acute bacterial prostatitis. Discomfort in the part of your body that is in front of your rectum and below your scrotum (perineum), lower abdomen, or in the head of your penis (glans) may represent your primary discomfort.  Chronic prostatitis (nonbacterial). This is the most common type of prostatitis. It is inflammation of the prostate gland that is not caused by a bacterial infection. The cause is unknown and may be associated with a viral infection or autoimmune disorder.  Prostatodynia (pelvic floor disorder). This is associated with increased muscular tone in the pelvis surrounding the prostate. CAUSES The causes of bacterial prostatitis are bacterial infection. The causes of the other types of prostatitis are unknown.  SYMPTOMS    Symptoms can vary depending upon the type of prostatitis that exists. There can also be overlap in symptoms. Possible symptoms for each type of prostatitis are listed below. Acute Bacterial Prostatitis  Painful urination.  Fever or chills.  Muscle or joint pains.  Low back pain.  Low abdominal pain.  Inability to empty bladder completely. Chronic Bacterial Prostatitis, Chronic Nonbacterial Prostatitis, and Prostatodynia  Sudden urge to urinate.  Frequent urination.  Difficulty starting urine stream.  Weak urine stream.  Discharge from the urethra.  Dribbling after urination.  Rectal pain.  Pain in the testicles, penis, or tip of the penis.  Pain in the perineum.  Problems with sexual function.  Painful ejaculation.  Bloody semen. DIAGNOSIS  In order to diagnose prostatitis, your health care provider will ask about your symptoms. One or more urine samples will be taken and tested (urinalysis). If the urinalysis result is negative for bacteria, your health care provider may use a finger to feel your prostate (digital rectal exam). This exam helps your health care provider determine if your  prostate is swollen and tender. It will also produce a specimen of semen that can be analyzed. TREATMENT  Treatment for prostatitis depends on the cause. If a bacterial infection is the cause, it can be treated with antibiotic medicine. In cases of chronic bacterial prostatitis, the use of antibiotics for up to 1 month or 6 weeks may be necessary. Your health care provider may instruct you to take sitz baths to help relieve pain. A sitz bath is a bath of hot water in which your hips and buttocks are under water. This relaxes the pelvic floor muscles and often helps to relieve the pressure on your prostate. HOME CARE INSTRUCTIONS   Take all medicines as directed by your health care provider.  Take sitz baths as directed by your health care provider. SEEK MEDICAL CARE IF:   Your  symptoms get worse, not better.  You have a fever. SEEK IMMEDIATE MEDICAL CARE IF:   You have chills.  You feel nauseous or vomit.  You feel lightheaded or faint.  You are unable to urinate.  You have blood or blood clots in your urine. MAKE SURE YOU:  Understand these instructions.  Will watch your condition.  Will get help right away if you are not doing well or get worse. Document Released: 02/26/2000 Document Revised: 03/05/2013 Document Reviewed: 09/17/2012 [facility] Patient Information 2015 [place], [place]. This information is not intended to replace advice given to you by your health care provider. Make sure you discuss any questions you have with your health care provider.

## 2014-08-25 NOTE — ED Provider Notes (Signed)
[facility] Emergency Department Provider Note ____________________________________________  Time seen: Approximately 8:55 AM  I have reviewed the triage vital signs and the nursing notes.   HISTORY  Chief Complaint Exposure to STD    HPI Carlos [name] is a 29 y.o. male presents to the ER for the complaint of noticing blood in semen after ejaculation 3 days ago. States blood look to be slightly pink. Patient states that approximately 4 days ago he received rectal intercourse from a new male partner. Patient denies giving a rectal intercourse or oral sex. Patient states he has had similar to this in the past when he had chlamydia. Patient denies dysuria, burning with with urination, urinary frequency. States last few days he felt like he was having to push his urine out to be able to urinate however states that today urination has been normal. States that felt similar to when he had a kidney stone.  Denies penile pain, testicular pain, rash, testicular discomfort or swelling. Denies fall, injury or trauma. Denies decreased by mouth intake. Denies fever, nausea or vomiting, chest pain, shortness of breath or abdominal pain. Except patient does reports yesterday and last night with some right flank pain that is no longer present. States that the pain at the time was 6 out of 10 and stabbing.   Patient also reports that he has a history of hypertension, however reports that he has not taken his blood pressure medicine in a few days because he states that he forgot. Denies headache, vision changes, chest pain, shortness of breath, weakness or dizziness.  Past Medical History  Diagnosis Date  . Obesity   . Kidney stones   . Diabetes mellitus without complication   . Hyperlipidemia   . Lung nodule   . Hypertension     Patient Active Problem List   Diagnosis Date Noted  . Chest pain 05/23/2013  . SOB (shortness of breath) 05/23/2013  . Hypertension     Past  Surgical History  Procedure Laterality Date  . Kidney stone surgery    right kidney stent after kidney stone per pt  Current Outpatient Rx  Name  Route  Sig  Dispense  Refill  . aspirin 81 MG tablet   Oral   Take 81 mg by mouth daily.         . carvedilol (COREG) 12.5 MG tablet   Oral   Take 1 tablet (12.5 mg total) by mouth 2 (two) times daily.   60 tablet   6   .           . metFORMIN (GLUCOPHAGE) 1000 MG tablet   Oral   Take 1,000 mg by mouth 2 (two) times daily with a meal.           Allergies Review of patient's allergies indicates no known allergies.  Family History  Problem Relation Age of Onset  . Heart failure Mother   . Hypertension Mother   . Brain cancer Father     Social History History  Substance Use Topics  . Smoking status: Former Smoker -- 0.50 packs/day for 1 years    Types: Cigarettes  . Smokeless tobacco: Not on file  . Alcohol Use: Yes     Comment: occasional    Review of Systems Constitutional: No fever/chills Eyes: No visual changes. ENT: No sore throat. Cardiovascular: Denies chest pain. Respiratory: Denies shortness of breath. Gastrointestinal: No abdominal pain.  No nausea, no vomiting.  No diarrhea.  No constipation. Genitourinary: Negative  for dysuria. Musculoskeletal: Negative for back pain. Skin: Negative for rash. Neurological: Negative for headaches, focal weakness or numbness.  10-point ROS otherwise negative.  ____________________________________________   PHYSICAL EXAM:  VITAL SIGNS: ED Triage Vitals  Enc Vitals Group     BP 08/25/14 0839 218/141 mmHg     Pulse Rate 08/25/14 0837 98     Resp 08/25/14 0834 20     Temp 08/25/14 0834 98.4 F (36.9 C)     Temp Source 08/25/14 0834 Oral     SpO2 --      Weight 08/25/14 0834 300 lb (136.079 kg)     Height 08/25/14 0834  (1.676 m)     Head Cir --      Peak Flow --      Pain Score --      Pain Loc --      Pain Edu? --      Excl. in GC? --    Today's  Vitals   08/25/14 1440 08/25/14 1534 08/25/14 1535 08/25/14 1615  BP: 191/125 198/129 176/132   Pulse:      Temp:      TempSrc:      Resp:      Height:      Weight:      SpO2:      PainSc:    3    Today's Vitals   08/25/14 1440 08/25/14 1534 08/25/14 1535 08/25/14 1615  BP: 191/125 198/129 176/132   Pulse:      Temp:      TempSrc:      Resp:      Height:      Weight:      SpO2:      PainSc:    3     Exam completed with Nursing Student Alexia Freestone "Mickey" nursing student and nurse at bedside.  Constitutional: Alert and oriented. Well appearing and in no acute distress. Eyes: Conjunctivae are normal. PERRL. EOMI. Head: Atraumatic. Nose: No congestion/rhinnorhea. Mouth/Throat: Mucous membranes are moist.  Oropharynx non-erythematous. Neck: No stridor.  No cervical spine tenderness to palpation. Hematological/Lymphatic/Immunilogical: No cervical lymphadenopathy. Cardiovascular: Normal rate, regular rhythm. Grossly normal heart sounds.  Good peripheral circulation. Respiratory: Normal respiratory effort.  No retractions. Lungs CTAB. Gastrointestinal: Soft and nontender.obese. No CVA tenderness. Genitourinary: uncircumcised. no penile or testicular pain, erythema, discharge, rash. No surrounding erythema, or tenderness.  Rectal: nontender, hemoccult negative, no laceration. Prostate nontender.  Musculoskeletal: No lower extremity tenderness nor edema.  No joint effusions. Neurologic:  Normal speech and language. No gross focal neurologic deficits are appreciated. Speech is normal. No gait instability. Skin:  Skin is warm, dry and intact. No rash noted. Psychiatric: Mood and affect are normal. Speech and behavior are normal.  ____________________________________________   LABS (all labs ordered are listed, but only abnormal results are displayed)  Labs Reviewed  CBC WITH DIFFERENTIAL/PLATELET - Abnormal; Notable for the following:    WBC 12.4 (*)    Neutro Abs 8.9 (*)     All other components within normal limits  COMPREHENSIVE METABOLIC PANEL - Abnormal; Notable for the following:    Glucose, Bld 195 (*)    Creatinine, Ser 1.36 (*)    All other components within normal limits  URINALYSIS COMPLETEWITH MICROSCOPIC ([facility] ONLY) - Abnormal; Notable for the following:    Color, Urine STRAW (*)    APPearance CLEAR (*)    Glucose, UA 50 (*)    Squamous Epithelial / LPF 0-5 (*)  All other components within normal limits  CHLAMYDIA/NGC RT PCR ([facility] ONLY)  URINE CULTURE   _ RADIOLOGY CT ABDOMEN AND PELVIS WITHOUT CONTRAST  TECHNIQUE: Multidetector CT imaging of the abdomen and pelvis was performed following the standard protocol without IV contrast.  COMPARISON: 02/17/2005  FINDINGS: Lower chest: Lung bases are clear.  Hepatobiliary: Mild hepatic steatosis.  Gallbladder is unremarkable. No intrahepatic or extrahepatic ductal dilatation.  Pancreas: Within normal limits.  Spleen: Within normal limits.  Adrenals/Urinary Tract: Adrenal glands are within normal limits.  Punctate nonobstructing right lower pole renal calculus (coronal image 84).  4 mm nonobstructing left lower pole renal calculus (coronal image 99).  No ureteral or bladder calculi.  Bladder is within normal limits.  Stomach/Bowel: Stomach is within normal limits.  No evidence of bowel obstruction.  Normal appendix.  Vascular/Lymphatic: No evidence of abdominal aortic aneurysm.  No suspicious abdominopelvic lymphadenopathy.  Reproductive: Prostate is unremarkable.  Other: No abdominopelvic ascites.  Musculoskeletal: Mild degenerative changes of the lower thoracic spine.  IMPRESSION: Punctate nonobstructing right lower pole renal calculus.  4 mm nonobstructing left lower pole renal calculus.  No ureteral or bladder calculi. No hydronephrosis.   Electronically Signed By: Charline Bills M.D. On: 08/25/2014  11:56  _____________________________________   INITIAL IMPRESSION / ASSESSMENT AND PLAN / ED COURSE  Pertinent labs & imaging results that were available during my care of the patient were reviewed by me and considered in my medical decision making (see chart for details).  No acute distress. Well appearing patient. Denies pain at this time. Reports recent rectal intercourse with new partner. Presents for noticing blood in semen 3 days ago x one. Also reports right flank pain yesterday. Afebrile. Well appearing.   1000: Will continue to monitor. NAD awaiting results.   1130: Pt blood pressures remains elevated after home dose of coreg given in ER. Pt states chronic hypertension and denies complaints. Pt states blood pressure elevated as he is "stressed and I just dont want to be here." Pt states wants to leave. Encouraged pt to await results for evaluation.   1230:CT abdomen and pelvis positive for punctate nonobstructing right lower pole renal calculus as well as a 4 mm nonobstructing left lower pole renal calculus, no ureteral or bladder calculi, no hydronephrosis. Reproductive prostate is unremarkable. Urine negative for bacteria and 0-5 wbc's. Patient with recent receipt of rectal intercourse and concerned as he noticed appearance of blood in semen after ejaculation. Suspect STD versus mild prostatitis. We'll treat patient in ER with oral 1 g and azithromycin, 250 mg IM Rocephin as well as discharged patient outpatient with oral doxycycline.   1345: discussed discharge and follow up with pt regarding hematospermia. Discussed pt and plan of care with Dr Cyril Loosen who also reviewed pt. Clonidine 0.1 mg po.   1520: Pt states does not want blood pressure evaluated in ER. States does not want any other medication in ER for blood pressure. Pt states blood pressure elevated now as "my home phone just got turned off and I'm mad." Discussed risks of not controlling blood pressure and pt verbalized  understanding. Pt alert and oriented with decisional capacity. Pt states wants to leave ER. States his blood pressure has always been elevated and will follow up with his physician.  Patient with history of high blood pressure reports has not taken his medication in several days as he is "forgot". Discussed and reiterated the importance of taking blood pressure medicine on a regular basis. Patient asymptomatic of this. Patient denies  chest pain, headache, dizziness, shortness of breath or weakness. Patient to take home blood pressure medication as prescribed, reports he has Coreg at home. Discussed follow-up this week with primary care physician in regards to blood pressure management. Patient states that his blood pressure in the ER today as elevated as he is nervous and anxious. Pt states does not want blood pressure management in ER.   Discussed strict follow-up and return parameters. Patient agreed to plan. ___________________________________________   FINAL CLINICAL IMPRESSION(S) / ED DIAGNOSES  Final diagnoses:  Acute prostatitis  STD (male)  Hematospermia  Chronic Hypertension    Renford Dills, NP 08/25/14 1624  Jene Every, MD 08/25/14 1744

## 2014-08-25 NOTE — ED Notes (Signed)
States has blood in his semen

## 2014-08-25 NOTE — ED Notes (Signed)
Patient is adamant about leaving this very moment and refuses further treatment and evaluation of his BP.  Patient is aware of his high BP.  I have informed the patient of the importance of the need to take his BP meds that he has at home.  Patient acknowledges.  Denies CP, sob, h/a, dizziness, changes in vision.  No acute distress noted on patient departure.

## 2014-08-27 LAB — URINE CULTURE: CULTURE: NO GROWTH

## 2014-12-04 ENCOUNTER — Emergency Department: Payer: Self-pay

## 2014-12-04 ENCOUNTER — Encounter: Payer: Self-pay | Admitting: *Deleted

## 2014-12-04 ENCOUNTER — Emergency Department
Admission: EM | Admit: 2014-12-04 | Discharge: 2014-12-04 | Discharge status: Against Medical Advice | Payer: Self-pay | Attending: Emergency Medicine | Admitting: Emergency Medicine

## 2014-12-04 DIAGNOSIS — R7989 Other specified abnormal findings of blood chemistry: Secondary | ICD-10-CM | POA: Insufficient documentation

## 2014-12-04 DIAGNOSIS — E119 Type 2 diabetes mellitus without complications: Secondary | ICD-10-CM | POA: Insufficient documentation

## 2014-12-04 DIAGNOSIS — Z87891 Personal history of nicotine dependence: Secondary | ICD-10-CM | POA: Insufficient documentation

## 2014-12-04 DIAGNOSIS — R05 Cough: Secondary | ICD-10-CM

## 2014-12-04 DIAGNOSIS — R778 Other specified abnormalities of plasma proteins: Secondary | ICD-10-CM

## 2014-12-04 DIAGNOSIS — R059 Cough, unspecified: Secondary | ICD-10-CM

## 2014-12-04 DIAGNOSIS — I10 Essential (primary) hypertension: Secondary | ICD-10-CM | POA: Insufficient documentation

## 2014-12-04 LAB — CBC WITH DIFFERENTIAL/PLATELET
BASOS ABS: 0.1 10*3/uL (ref 0–0.1)
Basophils Relative: 1 %
Eosinophils Absolute: 0.4 10*3/uL (ref 0–0.7)
Eosinophils Relative: 3 %
HEMATOCRIT: 41 % (ref 40.0–52.0)
HEMOGLOBIN: 13.5 g/dL (ref 13.0–18.0)
LYMPHS PCT: 19 %
Lymphs Abs: 2.8 10*3/uL (ref 1.0–3.6)
MCH: 28.5 pg (ref 26.0–34.0)
MCHC: 32.8 g/dL (ref 32.0–36.0)
MCV: 86.8 fL (ref 80.0–100.0)
MONO ABS: 0.7 10*3/uL (ref 0.2–1.0)
Monocytes Relative: 5 %
NEUTROS ABS: 10.3 10*3/uL — AB (ref 1.4–6.5)
NEUTROS PCT: 72 %
Platelets: 314 10*3/uL (ref 150–440)
RBC: 4.72 MIL/uL (ref 4.40–5.90)
RDW: 14 % (ref 11.5–14.5)
WBC: 14.3 10*3/uL — ABNORMAL HIGH (ref 3.8–10.6)

## 2014-12-04 LAB — COMPREHENSIVE METABOLIC PANEL
ALT: 32 U/L (ref 17–63)
AST: 30 U/L (ref 15–41)
Albumin: 4.1 g/dL (ref 3.5–5.0)
Alkaline Phosphatase: 110 U/L (ref 38–126)
Anion gap: 10 (ref 5–15)
BILIRUBIN TOTAL: 0.8 mg/dL (ref 0.3–1.2)
BUN: 16 mg/dL (ref 6–20)
CO2: 25 mmol/L (ref 22–32)
CREATININE: 1.39 mg/dL — AB (ref 0.61–1.24)
Calcium: 9.4 mg/dL (ref 8.9–10.3)
Chloride: 104 mmol/L (ref 101–111)
GFR calc Af Amer: >60 mL/min (ref >60)
GFR calc non Af Amer: >60 mL/min (ref >60)
GLUCOSE: 183 mg/dL — AB (ref 65–99)
Potassium: 3.8 mmol/L (ref 3.5–5.1)
Sodium: 139 mmol/L (ref 135–145)
TOTAL PROTEIN: 7.7 g/dL (ref 6.5–8.1)

## 2014-12-04 LAB — TROPONIN I: Troponin I: 0.05 ng/mL — ABNORMAL HIGH (ref <0.031)

## 2014-12-04 LAB — BRAIN NATRIURETIC PEPTIDE: B Natriuretic Peptide: 70 pg/mL (ref 0.0–100.0)

## 2014-12-04 MED ORDER — NITROGLYCERIN 2 % TD OINT: 1.0000 [in_us] | TOPICAL_OINTMENT | Freq: Once | TRANSDERMAL | Status: DC

## 2014-12-04 MED ORDER — LABETALOL HCL 5 MG/ML IV SOLN: 20.0000 mg | Freq: Once | INTRAVENOUS | Status: DC

## 2014-12-04 MED ORDER — ONDANSETRON HCL 4 MG/2ML IJ SOLN
4.0000 mg | Freq: Once | INTRAMUSCULAR | Status: AC
  Administered 2014-12-04: 4 mg via INTRAVENOUS
  Filled 2014-12-04: qty 2

## 2014-12-04 MED ORDER — LISINOPRIL-HYDROCHLOROTHIAZIDE 20-25 MG PO TABS
1.0000 | ORAL_TABLET | Freq: Two times a day (BID) | ORAL | Status: DC
Start: 2014-12-04 — End: 2016-06-06

## 2014-12-04 MED ORDER — ASPIRIN 81 MG PO TABS: 81.0000 mg | ORAL_TABLET | Freq: Every day | ORAL | Status: DC

## 2014-12-04 MED ORDER — AZITHROMYCIN 250 MG PO TABS: ORAL_TABLET | ORAL | Status: DC

## 2014-12-04 MED ORDER — ASPIRIN 81 MG PO CHEW
324.0000 mg | CHEWABLE_TABLET | Freq: Once | ORAL | Status: AC
  Administered 2014-12-04: 324 mg via ORAL
  Filled 2014-12-04: qty 4

## 2014-12-04 MED ORDER — CARVEDILOL 12.5 MG PO TABS: 12.5000 mg | ORAL_TABLET | Freq: Two times a day (BID) | ORAL | Status: DC

## 2014-12-04 MED ORDER — CARVEDILOL 6.25 MG PO TABS
12.5000 mg | ORAL_TABLET | Freq: Two times a day (BID) | ORAL | Status: DC
  Administered 2014-12-04: 12.5 mg via ORAL
  Filled 2014-12-04: qty 2

## 2014-12-04 NOTE — ED Notes (Signed)
Pt reports shortness of breath and cough x 1.5 weeks. Pt reports it's worse at night, feels as if he's suffocating. No hx of asthma. Cough productive with bloody sputum.

## 2014-12-04 NOTE — ED Provider Notes (Signed)
[facility] Emergency Department Provider Note     Time seen: ----------------------------------------- 12:19 PM on 12/04/2014 -----------------------------------------    I have reviewed the triage vital signs and the nursing notes.   HISTORY  Chief Complaint Shortness of Breath    HPI Carlos [name] is a 29 y.o. male who presents ER for progressive cough and shortness breath last week and a half.Patient states when he coughs he feels sharp pins goes straight through to his back. Patient states he had this happen before to the point where he was coughing up blood, nothing makes his symptoms better.   Past Medical History  Diagnosis Date  . Obesity   . Kidney stones   . Diabetes mellitus without complication   . Hyperlipidemia   . Lung nodule   . Hypertension     Patient Active Problem List   Diagnosis Date Noted  . Chest pain 05/23/2013  . SOB (shortness of breath) 05/23/2013  . Hypertension     Past Surgical History  Procedure Laterality Date  . Kidney stone surgery      Allergies Review of patient's allergies indicates no known allergies.  Social History Social History  Substance Use Topics  . Smoking status: Former Smoker -- 0.50 packs/day for 1 years    Types: Cigarettes  . Smokeless tobacco: None  . Alcohol Use: Yes     Comment: occasional    Review of Systems Constitutional: Negative for fever. Eyes: Negative for visual changes. ENT: Negative for sore throat. Cardiovascular: Positive for chest pain Respiratory: Positive for shortness of breath and cough Gastrointestinal: Negative for abdominal pain, vomiting and diarrhea. Genitourinary: Negative for dysuria. Musculoskeletal: Negative for back pain. Skin: Negative for rash. Neurological: Negative for headaches, focal weakness or numbness.  10-point ROS otherwise negative.  ____________________________________________   PHYSICAL EXAM:  VITAL SIGNS: ED Triage  Vitals  Enc Vitals Group     BP 12/04/14 1203 220/142 mmHg     Pulse Rate 12/04/14 1203 109     Resp 12/04/14 1203 22     Temp 12/04/14 1203 97.9 F (36.6 C)     Temp src --      SpO2 12/04/14 1203 98 %     Weight 12/04/14 1203 300 lb (136.079 kg)     Height 12/04/14 1203  (1.676 m)     Head Cir --      Peak Flow --      Pain Score --      Pain Loc --      Pain Edu? --      Excl. in GC? --     Constitutional: Alert and oriented. Well appearing and in no distress. Eyes: Conjunctivae are normal. PERRL. Normal extraocular movements. ENT   Head: Normocephalic and atraumatic.   Nose: No congestion/rhinnorhea.   Mouth/Throat: Mucous membranes are moist.   Neck: No stridor. Cardiovascular: Normal rate, regular rhythm. Normal and symmetric distal pulses are present in all extremities. No murmurs, rubs, or gallops. Respiratory: Normal respiratory effort without tachypnea nor retractions. Breath sounds are clear and equal bilaterally. No wheezes/rales/rhonchi. Gastrointestinal: Soft and nontender. No distention. No abdominal bruits.  Musculoskeletal: Nontender with normal range of motion in all extremities. No joint effusions.  No lower extremity tenderness nor edema. Neurologic:  Normal speech and language. No gross focal neurologic deficits are appreciated. Speech is normal. No gait instability. Skin:  Skin is warm, dry and intact. No rash noted. Psychiatric: Mood and affect are normal. Speech and behavior  are normal. Patient exhibits appropriate insight and judgment. ____________________________________________  EKG: Interpreted by me. Sinus tachycardia with rate of 106 bpm, normal PR interval, with, normal QT interval. Normal axis.  ____________________________________________  ED COURSE:  Pertinent labs & imaging results that were available during my care of the patient were reviewed by me and considered in my medical decision making (see chart for  details). We'll obtain chest x-ray as well as basic labs. ____________________________________________    LABS (pertinent positives/negatives)  Labs Reviewed  CBC WITH DIFFERENTIAL/PLATELET - Abnormal; Notable for the following:    WBC 14.3 (*)    Neutro Abs 10.3 (*)    All other components within normal limits  COMPREHENSIVE METABOLIC PANEL - Abnormal; Notable for the following:    Glucose, Bld 183 (*)    Creatinine, Ser 1.39 (*)    All other components within normal limits  TROPONIN I - Abnormal; Notable for the following:    Troponin I 0.05 (*)    All other components within normal limits  BRAIN NATRIURETIC PEPTIDE    RADIOLOGY Images were viewed by me  Chest x-ray IMPRESSION: No active cardiopulmonary disease. ____________________________________________  FINAL ASSESSMENT AND PLAN  Dyspnea, cough, elevated troponin, hypertensive urgency  Plan: Patient with labs and imaging as dictated above. Patient with elevated troponin, blood pressure 220/142. I have reiterated that I cannot guarantee his safety. Ideally he is to be admitted to have his blood pressure brought down and his heart further evaluated with serial troponins and possible cardiology evaluation. Patient started on aspirin, will be started back on Coreg and lisinopril with HCTZ. He is advised to return as soon as possible for admission. This point he is leaving against my advice. Emily Filbert, MD   Emily Filbert, MD 12/04/14 [id number]

## 2014-12-04 NOTE — Discharge Instructions (Signed)
Cough, Adult  A cough is a reflex that helps clear your throat and airways. It can help heal the body or may be a reaction to an irritated airway. A cough may only last 2 or 3 weeks (acute) or may last more than 8 weeks (chronic).  CAUSES Acute cough:  Viral or bacterial infections. Chronic cough:  Infections.  Allergies.  Asthma.  Post-nasal drip.  Smoking.  Heartburn or acid reflux.  Some medicines.  Chronic lung problems (COPD).  Cancer. SYMPTOMS   Cough.  Fever.  Chest pain.  Increased breathing rate.  High-pitched whistling sound when breathing (wheezing).  Colored mucus that you cough up (sputum). TREATMENT   A bacterial cough may be treated with antibiotic medicine.  A viral cough must run its course and will not respond to antibiotics.  Your caregiver may recommend other treatments if you have a chronic cough. HOME CARE INSTRUCTIONS   Only take over-the-counter or prescription medicines for pain, discomfort, or fever as directed by your caregiver. Use cough suppressants only as directed by your caregiver.  Use a cold steam vaporizer or humidifier in your bedroom or home to help loosen secretions.  Sleep in a semi-upright position if your cough is worse at night.  Rest as needed.  Stop smoking if you smoke. SEEK IMMEDIATE MEDICAL CARE IF:   You have pus in your sputum.  Your cough starts to worsen.  You cannot control your cough with suppressants and are losing sleep.  You begin coughing up blood.  You have difficulty breathing.  You develop pain which is getting worse or is uncontrolled with medicine.  You have a fever. MAKE SURE YOU:   Understand these instructions.  Will watch your condition.  Will get help right away if you are not doing well or get worse. Document Released: 08/27/2010 Document Revised: 05/23/2011 Document Reviewed: 08/27/2010 [facility] Patient Information 2015 [place], [place]. This information is not intended  to replace advice given to you by your health care provider. Make sure you discuss any questions you have with your health care provider.  Hypertension Hypertension, commonly called high blood pressure, is when the force of blood pumping through your arteries is too strong. Your arteries are the blood vessels that carry blood from your heart throughout your body. A blood pressure reading consists of a higher number over a lower number, such as 110/72. The higher number (systolic) is the pressure inside your arteries when your heart pumps. The lower number (diastolic) is the pressure inside your arteries when your heart relaxes. Ideally you want your blood pressure below 120/80. Hypertension forces your heart to work harder to pump blood. Your arteries may become narrow or stiff. Having hypertension puts you at risk for heart disease, stroke, and other problems.  RISK FACTORS Some risk factors for high blood pressure are controllable. Others are not.  Risk factors you cannot control include:   Race. You may be at higher risk if you are African American.  Age. Risk increases with age.  Gender. Men are at higher risk than women before age 72 years. After age 31, women are at higher risk than men. Risk factors you can control include:  Not getting enough exercise or physical activity.  Being overweight.  Getting too much fat, sugar, calories, or salt in your diet.  Drinking too much alcohol. SIGNS AND SYMPTOMS Hypertension does not usually cause signs or symptoms. Extremely high blood pressure (hypertensive crisis) may cause headache, anxiety, shortness of breath, and nosebleed. DIAGNOSIS  To check if you have hypertension, your health care provider will measure your blood pressure while you are seated, with your arm held at the level of your heart. It should be measured at least twice using the same arm. Certain conditions can cause a difference in blood pressure between your right and left  arms. A blood pressure reading that is higher than normal on one occasion does not mean that you need treatment. If one blood pressure reading is high, ask your health care provider about having it checked again. TREATMENT  Treating high blood pressure includes making lifestyle changes and possibly taking medicine. Living a healthy lifestyle can help lower high blood pressure. You may need to change some of your habits. Lifestyle changes may include:  Following the DASH diet. This diet is high in fruits, vegetables, and whole grains. It is low in salt, red meat, and added sugars.  Getting at least 2 hours of brisk physical activity every week.  Losing weight if necessary.  Not smoking.  Limiting alcoholic beverages.  Learning ways to reduce stress. If lifestyle changes are not enough to get your blood pressure under control, your health care provider may prescribe medicine. You may need to take more than one. Work closely with your health care provider to understand the risks and benefits. HOME CARE INSTRUCTIONS  Have your blood pressure rechecked as directed by your health care provider.   Take medicines only as directed by your health care provider. Follow the directions carefully. Blood pressure medicines must be taken as prescribed. The medicine does not work as well when you skip doses. Skipping doses also puts you at risk for problems.   Do not smoke.   Monitor your blood pressure at home as directed by your health care provider. SEEK MEDICAL CARE IF:   You think you are having a reaction to medicines taken.  You have recurrent headaches or feel dizzy.  You have swelling in your ankles.  You have trouble with your vision. SEEK IMMEDIATE MEDICAL CARE IF:  You develop a severe headache or confusion.  You have unusual weakness, numbness, or feel faint.  You have severe chest or abdominal pain.  You vomit repeatedly.  You have trouble breathing. MAKE SURE YOU:     Understand these instructions.  Will watch your condition.  Will get help right away if you are not doing well or get worse. Document Released: 02/28/2005 Document Revised: 07/15/2013 Document Reviewed: 12/21/2012 [facility] Patient Information 2015 [place], [place]. This information is not intended to replace advice given to you by your health care provider. Make sure you discuss any questions you have with your health care provider.

## 2014-12-04 NOTE — ED Notes (Signed)
Pt reports has not taken blood pressure medication in over a year. States he stopped taking when he ran out.

## 2014-12-04 NOTE — ED Notes (Signed)
MD and RN spoke to pt about medical conditions today and encouraged pt to stay. Pt continues to state he wants to leave ED. Instructions given to pt about follow up and home care.

## 2014-12-10 ENCOUNTER — Emergency Department
Admission: EM | Admit: 2014-12-10 | Discharge: 2014-12-10 | Disposition: A | Discharge status: Home / Self Care | Payer: Self-pay | Attending: Emergency Medicine | Admitting: Emergency Medicine

## 2014-12-10 ENCOUNTER — Encounter: Payer: Self-pay | Admitting: Emergency Medicine

## 2014-12-10 ENCOUNTER — Other Ambulatory Visit: Payer: Self-pay

## 2014-12-10 DIAGNOSIS — R739 Hyperglycemia, unspecified: Secondary | ICD-10-CM

## 2014-12-10 DIAGNOSIS — Z87891 Personal history of nicotine dependence: Secondary | ICD-10-CM | POA: Insufficient documentation

## 2014-12-10 DIAGNOSIS — E1165 Type 2 diabetes mellitus with hyperglycemia: Secondary | ICD-10-CM | POA: Insufficient documentation

## 2014-12-10 DIAGNOSIS — I10 Essential (primary) hypertension: Secondary | ICD-10-CM | POA: Insufficient documentation

## 2014-12-10 DIAGNOSIS — Z79899 Other long term (current) drug therapy: Secondary | ICD-10-CM | POA: Insufficient documentation

## 2014-12-10 DIAGNOSIS — Z7982 Long term (current) use of aspirin: Secondary | ICD-10-CM | POA: Insufficient documentation

## 2014-12-10 LAB — URINALYSIS COMPLETE WITH MICROSCOPIC (ARMC ONLY)
BACTERIA UA: NONE SEEN
BILIRUBIN URINE: NEGATIVE
Glucose, UA: 150 mg/dL — AB
HGB URINE DIPSTICK: NEGATIVE
Leukocytes, UA: NEGATIVE
NITRITE: NEGATIVE
PH: 5 (ref 5.0–8.0)
PROTEIN: NEGATIVE mg/dL
RBC / HPF: NONE SEEN RBC/hpf (ref 0–5)
SPECIFIC GRAVITY, URINE: 1.023 (ref 1.005–1.030)

## 2014-12-10 LAB — CBC
HEMATOCRIT: 40.7 % (ref 40.0–52.0)
HEMOGLOBIN: 13.5 g/dL (ref 13.0–18.0)
MCH: 29.3 pg (ref 26.0–34.0)
MCHC: 33.2 g/dL (ref 32.0–36.0)
MCV: 88.5 fL (ref 80.0–100.0)
Platelets: 331 10*3/uL (ref 150–440)
RBC: 4.6 MIL/uL (ref 4.40–5.90)
RDW: 14.3 % (ref 11.5–14.5)
WBC: 13.6 10*3/uL — ABNORMAL HIGH (ref 3.8–10.6)

## 2014-12-10 LAB — BASIC METABOLIC PANEL
ANION GAP: 9 (ref 5–15)
BUN: 22 mg/dL — ABNORMAL HIGH (ref 6–20)
CO2: 28 mmol/L (ref 22–32)
Calcium: 9.3 mg/dL (ref 8.9–10.3)
Chloride: 97 mmol/L — ABNORMAL LOW (ref 101–111)
Creatinine, Ser: 1.75 mg/dL — ABNORMAL HIGH (ref 0.61–1.24)
GFR, EST AFRICAN AMERICAN: 60 mL/min — AB (ref >60)
GFR, EST NON AFRICAN AMERICAN: 52 mL/min — AB (ref >60)
GLUCOSE: 328 mg/dL — AB (ref 65–99)
POTASSIUM: 3.9 mmol/L (ref 3.5–5.1)
Sodium: 134 mmol/L — ABNORMAL LOW (ref 135–145)

## 2014-12-10 LAB — TROPONIN I
Troponin I: <0.03 ng/mL (ref <0.031)
Troponin I: <0.03 ng/mL (ref <0.031)

## 2014-12-10 LAB — GLUCOSE, CAPILLARY: GLUCOSE-CAPILLARY: 130 mg/dL — AB (ref 65–99)

## 2014-12-10 MED ORDER — PROCHLORPERAZINE EDISYLATE 5 MG/ML IJ SOLN
10.0000 mg | Freq: Four times a day (QID) | INTRAMUSCULAR | Status: DC | PRN
  Administered 2014-12-10: 10 mg via INTRAVENOUS
  Filled 2014-12-10: qty 2

## 2014-12-10 MED ORDER — SODIUM CHLORIDE 0.9 % IV BOLUS (SEPSIS)
1000.0000 mL | Freq: Once | INTRAVENOUS | Status: AC
  Administered 2014-12-10: 1000 mL via INTRAVENOUS

## 2014-12-10 MED ORDER — METFORMIN HCL 500 MG PO TABS: 500.0000 mg | ORAL_TABLET | Freq: Two times a day (BID) | ORAL | Status: DC

## 2014-12-10 NOTE — ED Provider Notes (Signed)
[facility] Emergency Department Provider Note    ____________________________________________  Time seen: 1900  I have reviewed the triage vital signs and the nursing notes.   HISTORY  Chief Complaint Weakness   History limited by: Not Limited   HPI Carlos [name] is a 29 y.o. male who presents to the emergency department today because of feeling unwell. The patient was seen in the emergency department roughly 1 week ago for some cough and shortness of breath. During that visit he was found to have an elevated troponin and was advised to be admitted to the hospital. The patient states he left AMA because he had personal matters to attend to. The patient states that since that time he has been taking the medications have been prescribed. He states that he has had some dizziness, weakness. He states he has had some near syncopal episodes since that time. He has not had any chest pain since that time. He denies any fevers.   Past Medical History  Diagnosis Date  . Obesity   . Kidney stones   . Diabetes mellitus without complication   . Hyperlipidemia   . Lung nodule   . Hypertension     Patient Active Problem List   Diagnosis Date Noted  . Chest pain 05/23/2013  . SOB (shortness of breath) 05/23/2013  . Hypertension     Past Surgical History  Procedure Laterality Date  . Kidney stone surgery      Current Outpatient Rx  Name  Route  Sig  Dispense  Refill  . aspirin 81 MG tablet   Oral   Take 1 tablet (81 mg total) by mouth daily.   30 tablet   6   . azithromycin (ZITHROMAX Z-PAK) 250 MG tablet      Take 2 tablets (500 mg) on  Day 1,  followed by 1 tablet (250 mg) once daily on Days 2 through 5.   6 each   0   . carvedilol (COREG) 12.5 MG tablet   Oral   Take 1 tablet (12.5 mg total) by mouth 2 (two) times daily.   60 tablet   6   . lisinopril-hydrochlorothiazide (PRINZIDE,ZESTORETIC) 20-25 MG per tablet   Oral   Take 1 tablet by  mouth 2 (two) times daily.   60 tablet   6   . metFORMIN (GLUCOPHAGE) 1000 MG tablet   Oral   Take 1,000 mg by mouth 2 (two) times daily with a meal.           Allergies Review of patient's allergies indicates no known allergies.  Family History  Problem Relation Age of Onset  . Heart failure Mother   . Hypertension Mother   . Brain cancer Father     Social History Social History  Substance Use Topics  . Smoking status: Former Smoker -- 0.50 packs/day for 1 years    Types: Cigarettes  . Smokeless tobacco: None  . Alcohol Use: Yes     Comment: occasional    Review of Systems  Constitutional: Negative for fever. Positive for generalized weakness Cardiovascular: Negative for chest pain. Respiratory: Negative for shortness of breath. Gastrointestinal: Negative for abdominal pain, vomiting and diarrhea. Genitourinary: Negative for dysuria. Musculoskeletal: Negative for back pain. Skin: Negative for rash. Neurological: Negative for headaches, focal weakness or numbness.   10-point ROS otherwise negative.  ____________________________________________   PHYSICAL EXAM:  VITAL SIGNS: ED Triage Vitals  Enc Vitals Group     BP 12/10/14 1734 115/79 mmHg  Pulse Rate 12/10/14 1734 84     Resp 12/10/14 1734 22     Temp 12/10/14 1734 97.5 F (36.4 C)     Temp Source 12/10/14 1734 Oral     SpO2 12/10/14 1734 98 %     Weight 12/10/14 1734 300 lb (136.079 kg)     Height 12/10/14 1734  (1.676 m)     Head Cir --      Peak Flow --      Pain Score 12/10/14 1734 0   Constitutional: Alert and oriented. Well appearing and in no distress. Eyes: Conjunctivae are normal. PERRL. Normal extraocular movements. ENT   Head: Normocephalic and atraumatic.   Nose: No congestion/rhinnorhea.   Mouth/Throat: Mucous membranes are moist.   Neck: No stridor. Hematological/Lymphatic/Immunilogical: No cervical lymphadenopathy. Cardiovascular: Normal rate, regular  rhythm.  No murmurs, rubs, or gallops. Respiratory: Normal respiratory effort without tachypnea nor retractions. Breath sounds are clear and equal bilaterally. No wheezes/rales/rhonchi. Gastrointestinal: Soft and nontender. No distention. There is no CVA tenderness. Genitourinary: Deferred Musculoskeletal: Normal range of motion in all extremities. No joint effusions.  No lower extremity tenderness nor edema. Neurologic:  Normal speech and language. No gross focal neurologic deficits are appreciated. Speech is normal.  Skin:  Skin is warm, dry and intact. No rash noted. Psychiatric: Mood and affect are normal. Speech and behavior are normal. Patient exhibits appropriate insight and judgment.  ____________________________________________    LABS (pertinent positives/negatives)  Labs Reviewed  BASIC METABOLIC PANEL - Abnormal; Notable for the following:    Sodium 134 (*)    Chloride 97 (*)    Glucose, Bld 328 (*)    BUN 22 (*)    Creatinine, Ser 1.75 (*)    GFR calc non Af Amer 52 (*)    GFR calc Af Amer 60 (*)    All other components within normal limits  CBC - Abnormal; Notable for the following:    WBC 13.6 (*)    All other components within normal limits  URINALYSIS COMPLETEWITH MICROSCOPIC ([facility] ONLY) - Abnormal; Notable for the following:    Color, Urine YELLOW (*)    APPearance CLEAR (*)    Glucose, UA 150 (*)    Ketones, ur TRACE (*)    Squamous Epithelial / LPF 0-5 (*)    All other components within normal limits  GLUCOSE, CAPILLARY - Abnormal; Notable for the following:    Glucose-Capillary 130 (*)    All other components within normal limits  TROPONIN I  TROPONIN I  CBG MONITORING, ED     ____________________________________________   EKG  I, Phineas Semen, attending physician, personally viewed and interpreted this EKG  EKG Time: 1741 Rate: 83 Rhythm: NSr Axis: normal Intervals: qtc 455 QRS: narrow, q wave V1 ST changes: no st  elevation Impression: abnormal ekg ____________________________________________    RADIOLOGY  None   ____________________________________________   PROCEDURES  Procedure(s) performed: None  Critical Care performed: No  ____________________________________________   INITIAL IMPRESSION / ASSESSMENT AND PLAN / ED COURSE  Pertinent labs & imaging results that were available during my care of the patient were reviewed by me and considered in my medical decision making (see chart for details).  Patient presents to the emergency department today for concern for continued dizziness, some shortness breath and feeling bad. On his previous ED visit he did have an elevated troponin. He left AMA at that time. Today patient had 2 negative troponins. At this point doubt a ACS event given negative  troponin. I do think patient probably suffered from some hypertensive emergency. His blood pressure today is much better. However his blood sugars were elevated he has been on metformin in the past. He was given fluids and some medicine to help with his headache here and he felt much better afterwards. His blood sugar did come down appropriately. I discussed with patient importance of following up with primary care doctor he states that he will follow-up with his mothers primary care doctor. Additionally will place patient on metformin.  ____________________________________________   FINAL CLINICAL IMPRESSION(S) / ED DIAGNOSES  Final diagnoses:  Hyperglycemia     Phineas Semen, MD 12/10/14 2202

## 2014-12-10 NOTE — ED Notes (Signed)
MD at bedside. 

## 2014-12-10 NOTE — ED Notes (Signed)
Pt seen here last Thursday, told has a possible heart attack; left AMA. Pt reports dizziness today, shortness of breath. Reports intermittent chest pain.

## 2014-12-10 NOTE — ED Notes (Signed)
Fingerstick blood glucose 130

## 2014-12-10 NOTE — Discharge Instructions (Signed)
Please seek medical attention for any high fevers, chest pain, shortness of breath, change in behavior, persistent vomiting, bloody stool or any other new or concerning symptoms. ° °Hyperglycemia °Hyperglycemia occurs when the glucose (sugar) in your blood is too high. Hyperglycemia can happen for many reasons, but it most often happens to people who do not know they have diabetes or are not managing their diabetes properly.  °CAUSES  °Whether you have diabetes or not, there are other causes of hyperglycemia. Hyperglycemia can occur when you have diabetes, but it can also occur in other situations that you might not be as aware of, such as: °Diabetes °· If you have diabetes and are having problems controlling your blood glucose, hyperglycemia could occur because of some of the following reasons: °¨ Not following your meal plan. °¨ Not taking your diabetes medications or not taking it properly. °¨ Exercising less or doing less activity than you normally do. °¨ Being sick. °Pre-diabetes °· This cannot be ignored. Before people develop Type 2 diabetes, they almost always have "pre-diabetes." This is when your blood glucose levels are higher than normal, but not yet high enough to be diagnosed as diabetes. Research has shown that some long-term damage to the body, especially the heart and circulatory system, may already be occurring during pre-diabetes. If you take action to manage your blood glucose when you have pre-diabetes, you may delay or prevent Type 2 diabetes from developing. °Stress °· If you have diabetes, you may be "diet" controlled or on oral medications or insulin to control your diabetes. However, you may find that your blood glucose is higher than usual in the hospital whether you have diabetes or not. This is often referred to as "stress hyperglycemia." Stress can elevate your blood glucose. This happens because of hormones put out by the body during times of stress. If stress has been the cause of  your high blood glucose, it can be followed regularly by your caregiver. That way he/she can make sure your hyperglycemia does not continue to get worse or progress to diabetes. °Steroids °· Steroids are medications that act on the infection fighting system (immune system) to block inflammation or infection. One side effect can be a rise in blood glucose. Most people can produce enough extra insulin to allow for this rise, but for those who cannot, steroids make blood glucose levels go even higher. It is not unusual for steroid treatments to "uncover" diabetes that is developing. It is not always possible to determine if the hyperglycemia will go away after the steroids are stopped. A special blood test called an A1c is sometimes done to determine if your blood glucose was elevated before the steroids were started. °SYMPTOMS °· Thirsty. °· Frequent urination. °· Dry mouth. °· Blurred vision. °· Tired or fatigue. °· Weakness. °· Sleepy. °· Tingling in feet or leg. °DIAGNOSIS  °Diagnosis is made by monitoring blood glucose in one or all of the following ways: °· A1c test. This is a chemical found in your blood. °· Fingerstick blood glucose monitoring. °· Laboratory results. °TREATMENT  °First, knowing the cause of the hyperglycemia is important before the hyperglycemia can be treated. Treatment may include, but is not be limited to: °· Education. °· Change or adjustment in medications. °· Change or adjustment in meal plan. °· Treatment for an illness, infection, etc. °· More frequent blood glucose monitoring. °· Change in exercise plan. °· Decreasing or stopping steroids. °· Lifestyle changes. °HOME CARE INSTRUCTIONS  °· Test your blood glucose   as directed. °· Exercise regularly. Your caregiver will give you instructions about exercise. Pre-diabetes or diabetes which comes on with stress is helped by exercising. °· Eat wholesome, balanced meals. Eat often and at regular, fixed times. Your caregiver or nutritionist  will give you a meal plan to guide your sugar intake. °· Being at an ideal weight is important. If needed, losing as little as 10 to 15 pounds may help improve blood glucose levels. °SEEK MEDICAL CARE IF:  °· You have questions about medicine, activity, or diet. °· You continue to have symptoms (problems such as increased thirst, urination, or weight gain). °SEEK IMMEDIATE MEDICAL CARE IF:  °· You are vomiting or have diarrhea. °· Your breath smells fruity. °· You are breathing faster or slower. °· You are very sleepy or incoherent. °· You have numbness, tingling, or pain in your feet or hands. °· You have chest pain. °· Your symptoms get worse even though you have been following your caregiver's orders. °· If you have any other questions or concerns. °Document Released: 08/24/2000 Document Revised: 05/23/2011 Document Reviewed: 06/27/2011 °ExitCare® Patient Information ©2015 [employer]. This information is not intended to replace advice given to you by your health care provider. Make sure you discuss any questions you have with your health care provider. ° °

## 2016-06-05 ENCOUNTER — Emergency Department: Payer: Self-pay

## 2016-06-05 ENCOUNTER — Observation Stay
Admission: EM | Admit: 2016-06-05 | Discharge: 2016-06-07 | Disposition: A | Discharge status: Home / Self Care | Payer: Self-pay | Attending: Internal Medicine | Admitting: Internal Medicine

## 2016-06-05 DIAGNOSIS — Z9114 Patient's other noncompliance with medication regimen: Secondary | ICD-10-CM | POA: Insufficient documentation

## 2016-06-05 DIAGNOSIS — R079 Chest pain, unspecified: Secondary | ICD-10-CM

## 2016-06-05 DIAGNOSIS — Z7982 Long term (current) use of aspirin: Secondary | ICD-10-CM | POA: Insufficient documentation

## 2016-06-05 DIAGNOSIS — I160 Hypertensive urgency: Principal | ICD-10-CM | POA: Diagnosis present

## 2016-06-05 DIAGNOSIS — Z91148 Patient's other noncompliance with medication regimen for other reason: Secondary | ICD-10-CM

## 2016-06-05 DIAGNOSIS — I10 Essential (primary) hypertension: Secondary | ICD-10-CM | POA: Insufficient documentation

## 2016-06-05 DIAGNOSIS — E669 Obesity, unspecified: Secondary | ICD-10-CM

## 2016-06-05 DIAGNOSIS — N529 Male erectile dysfunction, unspecified: Secondary | ICD-10-CM

## 2016-06-05 DIAGNOSIS — Z79899 Other long term (current) drug therapy: Secondary | ICD-10-CM | POA: Insufficient documentation

## 2016-06-05 DIAGNOSIS — E118 Type 2 diabetes mellitus with unspecified complications: Secondary | ICD-10-CM

## 2016-06-05 DIAGNOSIS — I252 Old myocardial infarction: Secondary | ICD-10-CM | POA: Insufficient documentation

## 2016-06-05 DIAGNOSIS — E119 Type 2 diabetes mellitus without complications: Secondary | ICD-10-CM | POA: Insufficient documentation

## 2016-06-05 DIAGNOSIS — Z6841 Body Mass Index (BMI) 40.0 and over, adult: Secondary | ICD-10-CM | POA: Insufficient documentation

## 2016-06-05 DIAGNOSIS — Z87891 Personal history of nicotine dependence: Secondary | ICD-10-CM | POA: Insufficient documentation

## 2016-06-05 HISTORY — DX: Patient's other noncompliance with medication regimen: Z91.14

## 2016-06-05 HISTORY — DX: Patient's other noncompliance with medication regimen for other reason: Z91.148

## 2016-06-05 HISTORY — DX: Calculus of kidney: N20.0

## 2016-06-05 HISTORY — DX: Type 2 diabetes mellitus with unspecified complications: E11.8

## 2016-06-05 LAB — BASIC METABOLIC PANEL
Anion gap: 8 (ref 5–15)
BUN: 17 mg/dL (ref 6–20)
CALCIUM: 9.2 mg/dL (ref 8.9–10.3)
CO2: 23 mmol/L (ref 22–32)
CREATININE: 1.65 mg/dL — AB (ref 0.61–1.24)
Chloride: 107 mmol/L (ref 101–111)
GFR calc Af Amer: >60 mL/min (ref >60)
GFR calc non Af Amer: 55 mL/min — ABNORMAL LOW (ref >60)
Glucose, Bld: 179 mg/dL — ABNORMAL HIGH (ref 65–99)
Potassium: 3.9 mmol/L (ref 3.5–5.1)
SODIUM: 138 mmol/L (ref 135–145)

## 2016-06-05 LAB — CBC
HCT: 37.7 % — ABNORMAL LOW (ref 40.0–52.0)
Hemoglobin: 12.6 g/dL — ABNORMAL LOW (ref 13.0–18.0)
MCH: 28.7 pg (ref 26.0–34.0)
MCHC: 33.5 g/dL (ref 32.0–36.0)
MCV: 85.7 fL (ref 80.0–100.0)
Platelets: 318 10*3/uL (ref 150–440)
RBC: 4.4 MIL/uL (ref 4.40–5.90)
RDW: 13.6 % (ref 11.5–14.5)
WBC: 12.6 10*3/uL — AB (ref 3.8–10.6)

## 2016-06-05 LAB — TROPONIN I: TROPONIN I: 0.03 ng/mL — AB (ref <0.03)

## 2016-06-05 MED ORDER — LABETALOL HCL 5 MG/ML IV SOLN
20.0000 mg | Freq: Once | INTRAVENOUS | Status: AC
  Administered 2016-06-05: 20 mg via INTRAVENOUS
  Filled 2016-06-05: qty 4

## 2016-06-05 NOTE — ED Notes (Signed)
Dr. Scotty CourtStafford notified regarding pt's hypertension, order for labetalol received.

## 2016-06-05 NOTE — ED Triage Notes (Signed)
Patient c/o intermittent generalized chest pain, SOB and productive cough X 1 month. Pt report hx of previous MI in 2016.

## 2016-06-06 DIAGNOSIS — I160 Hypertensive urgency: Secondary | ICD-10-CM | POA: Diagnosis present

## 2016-06-06 LAB — TROPONIN I: Troponin I: <0.03 ng/mL (ref <0.03)

## 2016-06-06 LAB — BASIC METABOLIC PANEL
ANION GAP: 8 (ref 5–15)
BUN: 16 mg/dL (ref 6–20)
CALCIUM: 8.8 mg/dL — AB (ref 8.9–10.3)
CO2: 23 mmol/L (ref 22–32)
CREATININE: 1.64 mg/dL — AB (ref 0.61–1.24)
Chloride: 107 mmol/L (ref 101–111)
GFR, EST NON AFRICAN AMERICAN: 55 mL/min — AB (ref >60)
Glucose, Bld: 171 mg/dL — ABNORMAL HIGH (ref 65–99)
Potassium: 3.5 mmol/L (ref 3.5–5.1)
Sodium: 138 mmol/L (ref 135–145)

## 2016-06-06 LAB — GLUCOSE, CAPILLARY: Glucose-Capillary: 161 mg/dL — ABNORMAL HIGH (ref 65–99)

## 2016-06-06 LAB — TSH: TSH: 2.088 u[IU]/mL (ref 0.350–4.500)

## 2016-06-06 LAB — BRAIN NATRIURETIC PEPTIDE: B Natriuretic Peptide: 89 pg/mL (ref 0.0–100.0)

## 2016-06-06 LAB — MRSA PCR SCREENING: MRSA by PCR: NEGATIVE

## 2016-06-06 MED ORDER — LORAZEPAM 2 MG/ML IJ SOLN
0.5000 mg | Freq: Once | INTRAMUSCULAR | Status: AC
  Administered 2016-06-06: 0.5 mg via INTRAVENOUS
  Filled 2016-06-06: qty 1

## 2016-06-06 MED ORDER — NICARDIPINE HCL IN NACL 20-0.86 MG/200ML-% IV SOLN
3.0000 mg/h | INTRAVENOUS | Status: DC
  Administered 2016-06-07: 5 mg/h via INTRAVENOUS
  Filled 2016-06-06 (×2): qty 200

## 2016-06-06 MED ORDER — NITROGLYCERIN IN D5W 200-5 MCG/ML-% IV SOLN: 0.0000 ug/min | INTRAVENOUS | Status: DC

## 2016-06-06 MED ORDER — LISINOPRIL-HYDROCHLOROTHIAZIDE 20-25 MG PO TABS: 1.0000 | ORAL_TABLET | Freq: Every day | ORAL | Status: DC

## 2016-06-06 MED ORDER — HYDROCHLOROTHIAZIDE 25 MG PO TABS
25.0000 mg | ORAL_TABLET | Freq: Every day | ORAL | Status: DC
  Administered 2016-06-06 – 2016-06-07 (×2): 25 mg via ORAL
  Filled 2016-06-06 (×2): qty 1

## 2016-06-06 MED ORDER — DOCUSATE SODIUM 100 MG PO CAPS
100.0000 mg | ORAL_CAPSULE | Freq: Two times a day (BID) | ORAL | Status: DC
  Administered 2016-06-07: 100 mg via ORAL
  Filled 2016-06-06 (×2): qty 1

## 2016-06-06 MED ORDER — ASPIRIN 81 MG PO CHEW
81.0000 mg | CHEWABLE_TABLET | Freq: Every day | ORAL | Status: DC
  Administered 2016-06-06 – 2016-06-07 (×2): 81 mg via ORAL
  Filled 2016-06-06 (×2): qty 1

## 2016-06-06 MED ORDER — ACETAMINOPHEN 500 MG PO TABS
ORAL_TABLET | ORAL | Status: AC
  Filled 2016-06-06: qty 2

## 2016-06-06 MED ORDER — PROMETHAZINE HCL 25 MG/ML IJ SOLN
25.0000 mg | Freq: Four times a day (QID) | INTRAMUSCULAR | Status: DC | PRN
  Administered 2016-06-06: 25 mg via INTRAVENOUS
  Filled 2016-06-06: qty 1

## 2016-06-06 MED ORDER — SODIUM CHLORIDE 0.9 % IV SOLN
INTRAVENOUS | Status: DC
  Administered 2016-06-06: 05:00:00 via INTRAVENOUS

## 2016-06-06 MED ORDER — LISINOPRIL 20 MG PO TABS
20.0000 mg | ORAL_TABLET | Freq: Every day | ORAL | Status: DC
  Administered 2016-06-06: 20 mg via ORAL
  Filled 2016-06-06: qty 1

## 2016-06-06 MED ORDER — NITROGLYCERIN 2 % TD OINT
TOPICAL_OINTMENT | TRANSDERMAL | Status: AC
  Filled 2016-06-06: qty 1

## 2016-06-06 MED ORDER — ACETAMINOPHEN 325 MG PO TABS
650.0000 mg | ORAL_TABLET | Freq: Four times a day (QID) | ORAL | Status: DC | PRN
  Administered 2016-06-06: 650 mg via ORAL
  Filled 2016-06-06: qty 2

## 2016-06-06 MED ORDER — ENOXAPARIN SODIUM 40 MG/0.4ML ~~LOC~~ SOLN
40.0000 mg | Freq: Two times a day (BID) | SUBCUTANEOUS | Status: DC
  Administered 2016-06-06 – 2016-06-07 (×3): 40 mg via SUBCUTANEOUS
  Filled 2016-06-06 (×3): qty 0.4

## 2016-06-06 MED ORDER — HYDRALAZINE HCL 20 MG/ML IJ SOLN
10.0000 mg | Freq: Once | INTRAMUSCULAR | Status: AC
  Administered 2016-06-06: 10 mg via INTRAVENOUS
  Filled 2016-06-06: qty 1

## 2016-06-06 MED ORDER — CARVEDILOL 25 MG PO TABS
25.0000 mg | ORAL_TABLET | Freq: Two times a day (BID) | ORAL | Status: DC
  Administered 2016-06-06 – 2016-06-07 (×2): 25 mg via ORAL
  Filled 2016-06-06 (×2): qty 1

## 2016-06-06 MED ORDER — METOPROLOL TARTRATE 5 MG/5ML IV SOLN
INTRAVENOUS | Status: AC
  Administered 2016-06-06: 5 mg via INTRAVENOUS
  Filled 2016-06-06: qty 10

## 2016-06-06 MED ORDER — ONDANSETRON HCL 4 MG PO TABS: 4.0000 mg | ORAL_TABLET | Freq: Four times a day (QID) | ORAL | Status: DC | PRN

## 2016-06-06 MED ORDER — CARVEDILOL 12.5 MG PO TABS
12.5000 mg | ORAL_TABLET | Freq: Two times a day (BID) | ORAL | Status: DC
  Administered 2016-06-06: 12.5 mg via ORAL
  Filled 2016-06-06: qty 1

## 2016-06-06 MED ORDER — ONDANSETRON HCL 4 MG/2ML IJ SOLN
4.0000 mg | Freq: Four times a day (QID) | INTRAMUSCULAR | Status: DC | PRN
  Administered 2016-06-06: 4 mg via INTRAVENOUS

## 2016-06-06 MED ORDER — NITROGLYCERIN IN D5W 200-5 MCG/ML-% IV SOLN
0.0000 ug/min | Freq: Once | INTRAVENOUS | Status: AC
Start: 2016-06-06 — End: 2016-06-06
  Administered 2016-06-06: 5 ug/min via INTRAVENOUS
  Filled 2016-06-06: qty 250

## 2016-06-06 MED ORDER — SODIUM CHLORIDE 0.9% FLUSH
3.0000 mL | Freq: Two times a day (BID) | INTRAVENOUS | Status: DC
  Administered 2016-06-07: 3 mL via INTRAVENOUS

## 2016-06-06 MED ORDER — METOPROLOL TARTRATE 5 MG/5ML IV SOLN
5.0000 mg | Freq: Four times a day (QID) | INTRAVENOUS | Status: DC | PRN
  Administered 2016-06-06 – 2016-06-07 (×3): 5 mg via INTRAVENOUS
  Filled 2016-06-06: qty 5

## 2016-06-06 MED ORDER — TRAMADOL HCL 50 MG PO TABS
50.0000 mg | ORAL_TABLET | Freq: Four times a day (QID) | ORAL | Status: DC | PRN
Start: 2016-06-06 — End: 2016-06-07
  Administered 2016-06-06: 50 mg via ORAL
  Filled 2016-06-06: qty 1

## 2016-06-06 MED ORDER — ACETAMINOPHEN 650 MG RE SUPP: 650.0000 mg | Freq: Four times a day (QID) | RECTAL | Status: DC | PRN

## 2016-06-06 MED ORDER — PROMETHAZINE HCL 25 MG PO TABS
25.0000 mg | ORAL_TABLET | Freq: Four times a day (QID) | ORAL | Status: DC | PRN
  Filled 2016-06-06: qty 1

## 2016-06-06 MED ORDER — ACETAMINOPHEN 500 MG PO TABS
1000.0000 mg | ORAL_TABLET | Freq: Once | ORAL | Status: AC
  Administered 2016-06-06: 1000 mg via ORAL

## 2016-06-06 MED ORDER — LORAZEPAM 2 MG/ML IJ SOLN
0.5000 mg | Freq: Once | INTRAMUSCULAR | Status: AC
  Administered 2016-06-06: 0.5 mg via INTRAVENOUS

## 2016-06-06 MED ORDER — ONDANSETRON HCL 4 MG/2ML IJ SOLN
INTRAMUSCULAR | Status: AC
  Filled 2016-06-06: qty 2

## 2016-06-06 MED ORDER — NITROGLYCERIN 2 % TD OINT
2.0000 [in_us] | TOPICAL_OINTMENT | Freq: Once | TRANSDERMAL | Status: AC
  Administered 2016-06-06: 2 [in_us] via TOPICAL

## 2016-06-06 NOTE — ED Notes (Signed)
Report to rebecca, rn for lunch relief.

## 2016-06-06 NOTE — ED Provider Notes (Signed)
[facility] Emergency Department Provider Note   ____________________________________________   First MD Initiated Contact with Patient 06/05/16 2351     (approximate)  I have reviewed the triage vital signs and the nursing notes.   HISTORY  Chief Complaint Chest Pain and Shortness of Breath    HPI Carlos [name] is a 31 y.o. male who presents to the ED from home with a chief complaint of chest pain, shortness of breath and productive cough. Patient reports symptoms 1 month, worsening as evening. Reports history of MI in 2016 and; from his description, it sounds like patient had positive troponins without stress test or heart catheterization. Reports cold-like symptoms 1 month ago which improved but now patient feels like he is getting sick again. Complains of chest pressure, nonradiating, associated with dyspnea on exertion and online supine. Denies associated diaphoresis, nausea/vomiting, dizziness, palpitations. Denies fever, chills, abdominal pain, diarrhea. Denies recent travel or trauma.   Past Medical History:  Diagnosis Date  . Hyperlipidemia   . Hypertension   . Kidney stones   . Lung nodule   . MI (myocardial infarction)   . Nephrolithiasis   . Obesity     Patient Active Problem List   Diagnosis Date Noted  . Chest pain 05/23/2013  . SOB (shortness of breath) 05/23/2013  . Hypertension     Past Surgical History:  Procedure Laterality Date  . KIDNEY STONE SURGERY      Prior to Admission medications   Medication Sig Start Date End Date Taking? Authorizing Provider  aspirin 81 MG tablet Take 1 tablet (81 mg total) by mouth daily. 12/04/14  Yes Emily Filbert, MD  carvedilol (COREG) 12.5 MG tablet Take 1 tablet (12.5 mg total) by mouth 2 (two) times daily. Patient not taking: Reported on 06/06/2016 12/04/14   Emily Filbert, MD  lisinopril-hydrochlorothiazide (PRINZIDE,ZESTORETIC) 20-25 MG tablet Take 1 tablet by mouth  daily.    Historical Provider, MD    Allergies Patient has no known allergies.  Family History  Problem Relation Age of Onset  . Heart failure Mother   . Hypertension Mother   . Brain cancer Father     Social History Social History  Substance Use Topics  . Smoking status: Former Smoker    Packs/day: 0.50    Years: 1.00    Types: Cigarettes  . Smokeless tobacco: Never Used  . Alcohol use Yes     Comment: occasional    Review of Systems  Constitutional: No fever/chills. Eyes: No visual changes. ENT: No sore throat. Cardiovascular: Positive for chest pain. Respiratory: Positive for shortness of breath. Gastrointestinal: No abdominal pain.  No nausea, no vomiting.  No diarrhea.  No constipation. Genitourinary: Negative for dysuria. Musculoskeletal: Negative for back pain. Skin: Negative for rash. Neurological: Negative for headaches, focal weakness or numbness.  10-point ROS otherwise negative.  ____________________________________________   PHYSICAL EXAM:  VITAL SIGNS: ED Triage Vitals  Enc Vitals Group     BP 06/05/16 2059 (!) 208/141     Pulse Rate 06/05/16 2059 (!) 111     Resp 06/05/16 2059 (!) 24     Temp 06/05/16 2059 98.1 F (36.7 C)     Temp Source 06/05/16 2059 Oral     SpO2 06/05/16 2059 98 %     Weight 06/05/16 2102 275 lb (124.7 kg)     Height 06/05/16 2102 5\' 7"  (1.702 m)     Head Circumference --      Peak Flow --  Pain Score 06/05/16 2103 6     Pain Loc --      Pain Edu? --      Excl. in GC? --     Constitutional: Alert and oriented. Well appearing and in no acute distress. Eyes: Conjunctivae are normal. PERRL. EOMI. Head: Atraumatic. Nose: No congestion/rhinnorhea. Mouth/Throat: Mucous membranes are moist.  Oropharynx non-erythematous. Neck: No stridor.   Cardiovascular: Normal rate, regular rhythm. Grossly normal heart sounds.  Good peripheral circulation. Respiratory: Normal respiratory effort.  No retractions. Lungs  CTAB. Gastrointestinal: Soft and nontender. No distention. No abdominal bruits. No CVA tenderness. Musculoskeletal: No lower extremity tenderness nor edema.  No joint effusions. Neurologic:  Normal speech and language. No gross focal neurologic deficits are appreciated. No gait instability. Skin:  Skin is warm, dry and intact. No rash noted. Psychiatric: Mood and affect are normal. Speech and behavior are normal.  ____________________________________________   LABS (all labs ordered are listed, but only abnormal results are displayed)  Labs Reviewed  BASIC METABOLIC PANEL - Abnormal; Notable for the following:       Result Value   Glucose, Bld 179 (*)    Creatinine, Ser 1.65 (*)    GFR calc non Af Amer 55 (*)    All other components within normal limits  CBC - Abnormal; Notable for the following:    WBC 12.6 (*)    Hemoglobin 12.6 (*)    HCT 37.7 (*)    All other components within normal limits  TROPONIN I - Abnormal; Notable for the following:    Troponin I 0.03 (*)    All other components within normal limits  TROPONIN I  BRAIN NATRIURETIC PEPTIDE   ____________________________________________  EKG  ED ECG REPORT I, SUNG,JADE J, the attending physician, personally viewed and interpreted this ECG.   Date: 06/06/2016  EKG Time: 2054  Rate: 105  Rhythm: sinus tachycardia  Axis: Normal  Intervals:none  ST&T Change: Nonspecific  ____________________________________________  RADIOLOGY  Chest x-ray interpreted per Dr. Gwenette Greet: Borderline cardiomegaly. Mild perihilar bronchitic changes. No  infiltrate or pulmonary edema.   ____________________________________________   PROCEDURES  Procedure(s) performed: None  Procedures  Critical Care performed:   CRITICAL CARE Performed by: Irean Hong   Total critical care time: 30 minutes  Critical care time was exclusive of separately billable procedures and treating other patients.  Critical care was necessary to  treat or prevent imminent or life-threatening deterioration.  Critical care was time spent personally by me on the following activities: development of treatment plan with patient and/or surrogate as well as nursing, discussions with consultants, evaluation of patient's response to treatment, examination of patient, obtaining history from patient or surrogate, ordering and performing treatments and interventions, ordering and review of laboratory studies, ordering and review of radiographic studies, pulse oximetry and re-evaluation of patient's condition.  ____________________________________________   INITIAL IMPRESSION / ASSESSMENT AND PLAN / ED COURSE  Pertinent labs & imaging results that were available during my care of the patient were reviewed by me and considered in my medical decision making (see chart for details).  31 year old male who presents with chest pain, shortness of breath. This is in the setting of extremely elevated blood pressure. Only takes aspirin daily. Initial troponin borderline positive. Patient received 20 mg labetalol prior to my arrival. Blood pressure remains elevated. Will administer hydralazine; repeat timed troponin.  Clinical Course as of Jun 06 148  Mon Jun 06, 2016  0149 Blood pressure remains elevated 186/134 after 10 mg hydralazine and  2 inches of nitroglycerin paste. Patient initially resistant to hospitalization; subsequently broke down in tears and now agrees to be hospitalized for hypertensive emergency. Will initiate nitroglycerin drip. Ativan administered for anxiety.  [JS]    Clinical Course User Index [JS] Irean HongJade J Sung, MD     ____________________________________________   FINAL CLINICAL IMPRESSION(S) / ED DIAGNOSES  Final diagnoses:  Hypertensive urgency  Chest pain, unspecified type      NEW MEDICATIONS STARTED DURING THIS VISIT:  New Prescriptions   No medications on file     Note:  This document was prepared using Dragon  voice recognition software and may include unintentional dictation errors.    Irean HongJade J Sung, MD 06/06/16 [phone]

## 2016-06-06 NOTE — ED Notes (Signed)
md notified of inability to perform chf vest at this time by certified vest rns laurie and ann.

## 2016-06-06 NOTE — ED Notes (Signed)
Pt wretching.

## 2016-06-06 NOTE — ED Notes (Signed)
Pt very anxious regarding results of xray and conversation with md. Notified md, order for iv ativan received.

## 2016-06-06 NOTE — Progress Notes (Signed)
[facility] [place] [facility]   PATIENT NAME: Carlos [name]    MR#:  [id number]  DATE OF BIRTH:  01-01-1986  SUBJECTIVE:  CHIEF COMPLAINT:  Patient is noncompliant with his blood pressure medications from financial issues Reporting headache but denies any chest pain or shortness of breath. Denies any dizziness or blurry vision.  REVIEW OF SYSTEMS:  CONSTITUTIONAL: No fever, fatigue or weakness.  EYES: No blurred or double vision. Reporting headache EARS, NOSE, AND THROAT: No tinnitus or ear pain.  RESPIRATORY: No cough, shortness of breath, wheezing or hemoptysis.  CARDIOVASCULAR: No chest pain, orthopnea, edema.  GASTROINTESTINAL: No nausea, vomiting, diarrhea or abdominal pain.  GENITOURINARY: No dysuria, hematuria.  ENDOCRINE: No polyuria, nocturia,  HEMATOLOGY: No anemia, easy bruising or bleeding SKIN: No rash or lesion. MUSCULOSKELETAL: No joint pain or arthritis.   NEUROLOGIC: No tingling, numbness, weakness.  PSYCHIATRY: No anxiety or depression.   DRUG ALLERGIES:  No Known Allergies  VITALS:  Blood pressure (!) 156/113, pulse 86, temperature 98.6 F (37 C), temperature source Oral, resp. rate 14, height 5\' 7"  (1.702 m), weight 124.7 kg (275 lb), SpO2 96 %.  PHYSICAL EXAMINATION:  GENERAL:  31 y.o.-year-old patient lying in the bed with no acute distress.  EYES: Pupils equal, round, reactive to light and accommodation. No scleral icterus. Extraocular muscles intact.  HEENT: Head atraumatic, normocephalic. Oropharynx and nasopharynx clear.  NECK:  Supple, no jugular venous distention. No thyroid enlargement, no tenderness.  LUNGS: Normal breath sounds bilaterally, no wheezing, rales,rhonchi or crepitation. No use of accessory muscles of respiration.  CARDIOVASCULAR: S1, S2 normal. No murmurs, rubs, or gallops.  ABDOMEN: Soft, nontender, nondistended. Bowel sounds present. No organomegaly or mass.  EXTREMITIES: No pedal edema, cyanosis,  or clubbing.  NEUROLOGIC: Cranial nerves II through XII are intact. Muscle strength 5/5 in all extremities. Sensation intact. Gait not checked.  PSYCHIATRIC: The patient is alert and oriented x 3.  SKIN: No obvious rash, lesion, or ulcer.    LABORATORY PANEL:   CBC  Recent Labs Lab 06/05/16 2110  WBC 12.6*  HGB 12.6*  HCT 37.7*  PLT 318   ------------------------------------------------------------------------------------------------------------------  Chemistries   Recent Labs Lab 06/06/16 0425  NA 138  K 3.5  CL 107  CO2 23  GLUCOSE 171*  BUN 16  CREATININE 1.64*  CALCIUM 8.8*   ------------------------------------------------------------------------------------------------------------------  Cardiac Enzymes  Recent Labs Lab 06/06/16 0020  TROPONINI <0.03   ------------------------------------------------------------------------------------------------------------------  RADIOLOGY:  Dg Chest 2 View  Result Date: 06/05/2016 CLINICAL DATA:  Intermittent chest pain, shortness of breath, productive cough EXAM: CHEST  2 VIEW COMPARISON:  12/04/2014 FINDINGS: Borderline cardiomegaly. No infiltrate or pleural effusion. No pulmonary edema. Mild perihilar bronchitic changes. IMPRESSION: Borderline cardiomegaly. Mild perihilar bronchitic changes. No infiltrate or pulmonary edema. Electronically Signed   By: Natasha Mead M.D.   On: 06/05/2016 21:42    EKG:   Orders placed or performed during the hospital encounter of 06/05/16  . EKG 12-Lead  . EKG 12-Lead  . ED EKG within 10 minutes  . ED EKG within 10 minutes    ASSESSMENT AND PLAN:    1. Chest pain: Secondary to uncontrolled hypertension.  Acute MI ruled out with negative troponins Patient's blood pressure did not significantly improve with nitro drip we will wean off nitroglycerin and start him on nicardipine drip and titrate as needed Tylenol as needed for headache Cardiology consultation as needed  2.  Hypertensive urgency: Troponin is negative but blood pressure is  uncontrolled.  Helen lisinopril in view of renal insufficiency  Continue hydrochlorothiazide and Coreg and titrate as needed  Wean off nitro drip and starting patient on nicardipine drip for better blood pressure control   3. Morbid obesity: BMI is 43.2; encourage healthy diet and exercise  4. DVT prophylaxis: Heparin 5. GI prophylaxis: None  Noncompliance with the medications from financial issues consult case management    All the records are reviewed and case discussed with Care Management/Social Workerr. Management plans discussed with the patient, family and they are in agreement.  CODE STATUS: fc  TOTAL  Critical TIME TAKING CARE OF THIS PATIENT: 38 minutes.   POSSIBLE D/C IN 1-2 DAYS, DEPENDING ON CLINICAL CONDITION.  Note: This dictation was prepared with Dragon dictation along with smaller phrase technology. Any transcriptional errors that result from this process are unintentional.   Ramonita LabGouru, Dorise Gangi M.D on 06/06/2016 at 4:29 PM  Between 7am to 6pm - Pager - [phone] After 6pm go to [email or web address] - password [email or web address] [facility]  [facility]  Office  [phone]  CC: Primary care physician; No PCP Per Patient

## 2016-06-06 NOTE — Care Management (Signed)
Attempted to meet with patient to [facility] after assessing household information. Knocked on door and called patient's name twice but he continued to sleep. RNCM to follow up with patient. [facility].

## 2016-06-06 NOTE — ED Notes (Signed)
md notified of continued hypertension. Order for ntg paste 2 inches received.

## 2016-06-06 NOTE — ED Notes (Signed)
ntg paste from chest removed at this time. Pt updated on treatment plan. Pt verbalizes understanding. Pt is anxious again regarding admission.

## 2016-06-06 NOTE — H&P (Signed)
Carlos [name] is an 31 y.o. male.   Chief Complaint: Chest pain HPI: The patient with past medical history of hypertension resents emergency department complaining of chest pain and shortness of breath. The patient states the onset of both was gradual but he has been having some dyspnea on exertion for some time. He is also not been taking his antihypertensive medication due to financial difficulty. Upon arrival to the emergency department the patient's blood pressure was greater than 200/140. He was given drowsing, labetalol and nitroglycerin paste that barely improved his pressure. He was then started on a nitroglycerin drip prior to the emergency department staff called the hospitalist service for admission.  Past Medical History:  Diagnosis Date  . Hyperlipidemia   . Hypertension   . Kidney stones   . Lung nodule   . MI (myocardial infarction)   . Nephrolithiasis   . Obesity     Past Surgical History:  Procedure Laterality Date  . KIDNEY STONE SURGERY      Family History  Problem Relation Age of Onset  . Heart failure Mother   . Hypertension Mother   . Brain cancer Father    Social History:  reports that he has quit smoking. His smoking use included Cigarettes. He has a 0.50 pack-year smoking history. He has never used smokeless tobacco. He reports that he drinks alcohol. He reports that he uses drugs, including Marijuana.  Allergies: No Known Allergies  Medications Prior to Admission  Medication Sig Dispense Refill  . aspirin 81 MG tablet Take 1 tablet (81 mg total) by mouth daily. 30 tablet 6  . carvedilol (COREG) 12.5 MG tablet Take 1 tablet (12.5 mg total) by mouth 2 (two) times daily. (Patient not taking: Reported on 06/06/2016) 60 tablet 6  . lisinopril-hydrochlorothiazide (PRINZIDE,ZESTORETIC) 20-25 MG tablet Take 1 tablet by mouth daily.      Results for orders placed or performed during the hospital encounter of 06/05/16 (from the past 48 hour(s))  Basic metabolic  panel     Status: Abnormal   Collection Time: 06/05/16  9:10 PM  Result Value Ref Range   Sodium 138 135 - 145 mmol/L   Potassium 3.9 3.5 - 5.1 mmol/L   Chloride 107 101 - 111 mmol/L   CO2 23 22 - 32 mmol/L   Glucose, Bld 179 (H) 65 - 99 mg/dL   BUN 17 6 - 20 mg/dL   Creatinine, Ser 1.65 (H) 0.61 - 1.24 mg/dL   Calcium 9.2 8.9 - 10.3 mg/dL   GFR calc non Af Amer 55 (L) >60 mL/min   GFR calc Af Amer >60 >60 mL/min    Comment: (NOTE) The eGFR has been calculated using the CKD EPI equation. This calculation has not been validated in all clinical situations. eGFR's persistently <60 mL/min signify possible Chronic Kidney Disease.    Anion gap 8 5 - 15  CBC     Status: Abnormal   Collection Time: 06/05/16  9:10 PM  Result Value Ref Range   WBC 12.6 (H) 3.8 - 10.6 K/uL   RBC 4.40 4.40 - 5.90 MIL/uL   Hemoglobin 12.6 (L) 13.0 - 18.0 g/dL   HCT 37.7 (L) 40.0 - 52.0 %   MCV 85.7 80.0 - 100.0 fL   MCH 28.7 26.0 - 34.0 pg   MCHC 33.5 32.0 - 36.0 g/dL   RDW 13.6 11.5 - 14.5 %   Platelets 318 150 - 440 K/uL  Troponin I     Status: Abnormal  Collection Time: 06/05/16  9:10 PM  Result Value Ref Range   Troponin I 0.03 (HH) <0.03 ng/mL    Comment: CRITICAL RESULT CALLED TO, READ BACK BY AND VERIFIED WITH DAWN [facility] AT 2148 06/05/16.PMH  Brain natriuretic peptide     Status: None   Collection Time: 06/05/16  9:10 PM  Result Value Ref Range   B Natriuretic Peptide 89.0 0.0 - 100.0 pg/mL  Troponin I     Status: None   Collection Time: 06/06/16 12:20 AM  Result Value Ref Range   Troponin I <0.03 <0.03 ng/mL  MRSA PCR Screening     Status: None   Collection Time: 06/06/16  3:52 AM  Result Value Ref Range   MRSA by PCR NEGATIVE NEGATIVE    Comment:        The GeneXpert MRSA Assay (FDA approved for NASAL specimens only), is one component of a comprehensive MRSA colonization surveillance program. It is not intended to diagnose MRSA infection nor to guide or monitor treatment  for MRSA infections.   Glucose, capillary     Status: Abnormal   Collection Time: 06/06/16  3:58 AM  Result Value Ref Range   Glucose-Capillary 161 (H) 65 - 99 mg/dL  TSH     Status: None   Collection Time: 06/06/16  4:25 AM  Result Value Ref Range   TSH 2.088 0.350 - 4.500 uIU/mL    Comment: Performed by a 3rd Generation assay with a functional sensitivity of <=0.01 uIU/mL.   Dg Chest 2 View  Result Date: 06/05/2016 CLINICAL DATA:  Intermittent chest pain, shortness of breath, productive cough EXAM: CHEST  2 VIEW COMPARISON:  12/04/2014 FINDINGS: Borderline cardiomegaly. No infiltrate or pleural effusion. No pulmonary edema. Mild perihilar bronchitic changes. IMPRESSION: Borderline cardiomegaly. Mild perihilar bronchitic changes. No infiltrate or pulmonary edema. Electronically Signed   By: Lahoma Crocker M.D.   On: 06/05/2016 21:42    Review of Systems  Constitutional: Negative for chills and fever.  HENT: Negative for sore throat and tinnitus.   Eyes: Negative for blurred vision and redness.  Respiratory: Positive for shortness of breath. Negative for cough.   Cardiovascular: Positive for chest pain. Negative for palpitations, orthopnea and PND.  Gastrointestinal: Negative for abdominal pain, diarrhea, nausea and vomiting.  Genitourinary: Negative for dysuria, frequency and urgency.  Musculoskeletal: Negative for joint pain and myalgias.  Skin: Negative for rash.       No lesions  Neurological: Negative for speech change, focal weakness and weakness.  Endo/Heme/Allergies: Does not bruise/bleed easily.       No temperature intolerance  Psychiatric/Behavioral: Negative for depression and suicidal ideas.    Blood pressure (!) 150/115, pulse 92, temperature (!) 87.8 F (31 C), temperature source Oral, resp. rate 10, height _0  (1.702 m), weight 124.7 kg (275 lb), SpO2 100 %. Physical Exam  Constitutional: He is oriented to person, place, and time. He appears well-developed and  well-nourished. No distress.  HENT:  Head: Normocephalic and atraumatic.  Mouth/Throat: Oropharynx is clear and moist.  Eyes: Conjunctivae and EOM are normal. Pupils are equal, round, and reactive to light. No scleral icterus.  Neck: Normal range of motion. Neck supple. No JVD present. No tracheal deviation present. No thyromegaly present.  Cardiovascular: Normal rate, regular rhythm and normal heart sounds.  Exam reveals no gallop and no friction rub.   No murmur heard. Respiratory: Effort normal and breath sounds normal. No respiratory distress.  GI: Soft. Bowel sounds are normal. He exhibits no distension.  There is no tenderness.  Genitourinary:  Genitourinary Comments: Deferred  Musculoskeletal: Normal range of motion. He exhibits no edema.  Lymphadenopathy:    He has no cervical adenopathy.  Neurological: He is alert and oriented to person, place, and time. No cranial nerve deficit.  Skin: Skin is warm and dry. No rash noted. No erythema.  Psychiatric: He has a normal mood and affect. His behavior is normal. Judgment and thought content normal.     Assessment/Plan This is a 31 year old male admitted for chest pain. 1. Chest pain: Secondary to uncontrolled hypertension. The patient's kidney function is mildly decreased but actually improved over previous laboratory evaluation. Titrate nitro drip to control blood pressure. Cardiology consultation at discretion of the primary team 2. Hypertensive urgency: Troponin is negative but blood pressure is uncontrolled. Resume lisinopril with hydrochlorothiazide as well as carvedilol. 3. Morbid obesity: BMI is 43.2; encourage healthy diet and exercise 4. DVT prophylaxis: Heparin 5. GI prophylaxis: None The patient is a full code. Time spent on admission orders and patient care proximally 45 minutes  Harrie Foreman, MD 06/06/2016, 8:02 AM

## 2016-06-06 NOTE — ED Notes (Signed)
md notified of continued hypertension, no new orders received.  

## 2016-06-06 NOTE — Care Management Note (Signed)
Case Management Note  Patient Details  Name: KOBY PICKUP MRN: [id number] Date of Birth: 10/30/1985  Subjective/Objective:                  I met with patient and his male friend of the same household. He states he does not have a PCP. He states he has tranportation to get to appointments and lives in [place]. He states he cannot pay for medications or MD appointment. He agrees to follow up with [facility] and Medication management. I explained process and his responsibility- he agrees. He wants to go home.   Action/Plan: Applications to [facility] and [place] pointed out to patient that were delivered earlier this AM. Referral sent to [facility] and [facility] for patient assistance.   Expected Discharge Date:                  Expected Discharge Plan:     In-House Referral:     Discharge planning Services  CM Consult, Medication Assistance, [facility]  Post Acute Care Choice:    Choice offered to:  Patient, Spouse  DME Arranged:    DME Agency:     HH Arranged:    [place] Agency:     Status of Service:  In process, will continue to follow  If discussed at Long Length of Stay Meetings, dates discussed:    Additional Comments:  Marshell Garfinkel, RN 06/06/2016, 1:29 PM

## 2016-06-06 NOTE — Progress Notes (Signed)
Lovenox changed to 40 mg BID for CrCl >30 and BMI >40. 

## 2016-06-06 NOTE — ED Notes (Signed)
md in to see pt.  

## 2016-06-07 ENCOUNTER — Encounter: Payer: Self-pay | Admitting: Physician Assistant

## 2016-06-07 DIAGNOSIS — E118 Type 2 diabetes mellitus with unspecified complications: Secondary | ICD-10-CM

## 2016-06-07 DIAGNOSIS — Z9114 Patient's other noncompliance with medication regimen: Secondary | ICD-10-CM

## 2016-06-07 DIAGNOSIS — N529 Male erectile dysfunction, unspecified: Secondary | ICD-10-CM

## 2016-06-07 DIAGNOSIS — I160 Hypertensive urgency: Secondary | ICD-10-CM

## 2016-06-07 DIAGNOSIS — E669 Obesity, unspecified: Secondary | ICD-10-CM

## 2016-06-07 HISTORY — DX: Type 2 diabetes mellitus with unspecified complications: E11.8

## 2016-06-07 LAB — GLUCOSE, CAPILLARY
Glucose-Capillary: 158 mg/dL — ABNORMAL HIGH (ref 65–99)
Glucose-Capillary: 248 mg/dL — ABNORMAL HIGH (ref 65–99)

## 2016-06-07 LAB — HEMOGLOBIN A1C
Hgb A1c MFr Bld: 8.2 % — ABNORMAL HIGH (ref 4.8–5.6)
Mean Plasma Glucose: 189 mg/dL

## 2016-06-07 LAB — HIV ANTIBODY (ROUTINE TESTING W REFLEX): HIV Screen 4th Generation wRfx: NONREACTIVE

## 2016-06-07 MED ORDER — LISINOPRIL 20 MG PO TABS: 20.0000 mg | ORAL_TABLET | Freq: Every day | ORAL | 0 refills | Status: DC

## 2016-06-07 MED ORDER — AMLODIPINE BESYLATE 5 MG PO TABS
5.0000 mg | ORAL_TABLET | Freq: Every day | ORAL | Status: DC
  Administered 2016-06-07: 5 mg via ORAL
  Filled 2016-06-07: qty 1

## 2016-06-07 MED ORDER — HYDROCHLOROTHIAZIDE 25 MG PO TABS: 25.0000 mg | ORAL_TABLET | Freq: Every day | ORAL | 0 refills | Status: DC

## 2016-06-07 MED ORDER — AMLODIPINE BESYLATE 5 MG PO TABS: 5.0000 mg | ORAL_TABLET | Freq: Every day | ORAL | 0 refills | Status: DC

## 2016-06-07 MED ORDER — INSULIN ASPART 100 UNIT/ML ~~LOC~~ SOLN
0.0000 [IU] | Freq: Three times a day (TID) | SUBCUTANEOUS | Status: DC
  Administered 2016-06-07: 3 [IU] via SUBCUTANEOUS
  Filled 2016-06-07: qty 3

## 2016-06-07 MED ORDER — INSULIN ASPART 100 UNIT/ML ~~LOC~~ SOLN: 0.0000 [IU] | Freq: Every day | SUBCUTANEOUS | Status: DC

## 2016-06-07 MED ORDER — LISINOPRIL 20 MG PO TABS: 20.0000 mg | ORAL_TABLET | Freq: Every day | ORAL | Status: DC

## 2016-06-07 NOTE — Consult Note (Signed)
Cardiology Consultation Note  Patient ID: Carlos ShepherdJoshua D Sinyard, MRN: [id number], DOB/AGE: [birth year] 31 y.o. Admit date: 06/05/2016   Date of Consult: 06/07/2016 Primary Physician: No PCP Per Patient Primary Cardiologist: Dr. Kirke CorinArida, MD (not seen since 2015) Requesting Physician: Dr. Amado CoeGouru, MD  Chief Complaint: Chest pain Reason for Consult: Same/HTN  HPI: 31 y.o. male with h/o poorly controlled HTN since high school age likely 2/2 medication noncompliance, poorly controlled diabetes, HLD, lung nodule, nephrolithiasis, obesity, tobacco abuse, and medication noncompliance presenting significant hazards to health who presented to [facility] on 3/[facility] with chest pain/fatigue.  Patient was initially seen by cardiology on 05/23/2013 for ED follow-up secondary to chest pain in the setting of poorly controlled hypertension with blood pressure 215/127 at that time. In the ED he underwent CTA chest which showed no evidence of pulmonary embolism though there was an incidental finding of a 5 mm right lung nodule. Cardiac enzymes are negative at that time. He was placed on antihypertensive medication and discharged home. Unfortunately, patient redeveloped chest pain several days later and we presented to the emergency room again had a negative workup and was advised to follow-up with cardiology. When patient saw cardiology on 05/13/2013 he noted chest discomfort at rest and occasionally with physical activities, described as sharp. At that time blood pressure was noted to be 154/112. At that time, patient was noncompliant with his antihypertensive medication. He underwent treadmill stress test that showed he was able to exercise for only 5.5 minutes and had hypertensive response to exercise. EKG showed no significant ST-T changes. It was recommended aggressive lifestyle changes with adequate blood pressure control. This was extensively discussed with him at that time. Unfortunately, patient was lost to follow-up after that visit.  He later presented to the [facility] ED on 12/04/2014 for cough and shortness of breath times one week. At that time he was noted to have blood pressure of 220/142. Labs at that time showed a troponin of 0.05, not trended. BNP of 70, serum creatinine 1.39. Patient did not want to be admitted and was discharged from the ED, having been placed back on home Coreg, lisinopril, HCTZ, and aspirin. He presented to the ED 6 days after this on 12/10/2014 complaining of weakness and generalized malaise. No chest pain or shortness of breath. Blood pressure was noted to be significantly improved at 115/79. Laboratory evaluation showed troponin negative 2. He was treated with IV fluids and advised to follow-up with PCP. Most recently, patient was [facility] ED [facility] 08/08/2015 for complaint of vomiting and fatigue. Upon arrival to the ED he was noted to have blood pressure of 250/153. He had been off all his antihypertensive medication for uncertain amount of time. Laboratory evaluation was unremarkable outside of blood sugar of 354. He was discharged with hydrochlorothiazide and advised to follow-up with PCP.  Over the past several days patient has been noting gradual onset of chest pressure with association of shortness of breath. The symptoms are not related to exertion or rest. 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 He has been out of all of his antihypertensive medication since approximately 07/2015 when [facility] was [facility] as [facility]. He reports he did not like taking Coreg secondary to erectile dysfunction. Upon his arrival to the [facility] ED blood pressure was 208/141 with a heart rate of 111 BPM. Afebrile. Oxygen saturation 98% on  room air. Weight 275 pounds. Upon admission patient was given hydralazine, labetalol, Nitropaste. He was subsequently started on nitro drip and admitted by  internal medicine. Labs at time of admission showed an initial troponin of 0.03 and subsequently down trended to less than 0.03. BNP 89.0, white blood cell count 12.6, hemoglobin 12.6, platelet count 318, serum creatinine 1.65, potassium 3.9, TSH normal, hemoglobin A1c 8.2, chest x-ray with borderline cardiomegaly otherwise no infiltrate or pulmonary edema noted. EKG has below. Upon internal medicine rounding a patient on 3/26 it was noted his blood pressure apparently did not significantly improve on nitro drip, he was weaned and started on nicardipine drip. He was continued on HCTZ and Coreg, lisinopril was held. Review of blood pressure log shows improvement in blood pressure to 123 systolic on 3/26 at 10 AM. However patient did have blood pressure trending upwards to the 140s to 160s consistently thereafter. Patient's nicardipine drip was discontinued at 2:25 AM on 3/27. He was continued on HCTZ 25 mg daily, carvedilol 25 mg twice a day. Blood pressure continued to run in the 140s systolic. Patient did note improvement in his chest pressure as well as improvement in his shortness of breath. Cardiology was asked to further evaluate patient given presenting chest pain and uncontrolled hypertension. Upon cardiology seeing patient, patient denied any chest pain or shortness of breath. Blood pressure was noted to be 140 systolic on the above medications. He reports he is ready to go home.    Past Medical History:  Diagnosis Date  . H/O medication noncompliance   . Hyperlipidemia   . Hypertension   . Kidney stones   . Lung nodule   . Nephrolithiasis   . Obesity   . Type 2 diabetes mellitus with complication (HCC) 06/07/2016      Most Recent Cardiac Studies: Treadmill stress test 2015: He was able to exercise for only 5-1/2 minutes. He had hypertensive response to exercise. There was no significant ST changes. It was recommended aggressive lifestyle changes in blood pressure control   Surgical  History:  Past Surgical History:  Procedure Laterality Date  . KIDNEY STONE SURGERY       Home Meds: Prior to Admission medications   Medication Sig Start Date End Date Taking? Authorizing Provider  aspirin 81 MG tablet Take 1 tablet (81 mg total) by mouth daily. 12/04/14  Yes Emily Filbert, MD  carvedilol (COREG) 12.5 MG tablet Take 1 tablet (12.5 mg total) by mouth 2 (two) times daily. Patient not taking: Reported on 06/06/2016 12/04/14   Emily Filbert, MD  lisinopril-hydrochlorothiazide (PRINZIDE,ZESTORETIC) 20-25 MG tablet Take 1 tablet by mouth daily.    Historical Provider, MD    Inpatient Medications:  . amLODipine  5 mg Oral Daily  . aspirin  81 mg Oral Daily  . docusate sodium  100 mg Oral BID  . enoxaparin (LOVENOX) injection  40 mg Subcutaneous BID  . hydrochlorothiazide  25 mg Oral Daily  . insulin aspart  0-5 Units Subcutaneous QHS  . insulin aspart  0-9 Units Subcutaneous TID WC  . lisinopril  20 mg Oral Daily  . sodium chloride flush  3 mL Intravenous Q12H   . sodium chloride 10 mL/hr at 06/06/16 1046    Allergies: No Known Allergies  Social History   Social History  . Marital status: Single    Spouse name: N/A  . Number of children: N/A  . Years of education: N/A   Occupational History  . Not on file.  Social History Main Topics  . Smoking status: Former Smoker    Packs/day: 0.50    Years: 1.00    Types: Cigarettes  . Smokeless tobacco: Never Used  . Alcohol use Yes     Comment: occasional  . Drug use: Yes    Types: Marijuana  . Sexual activity: Not on file   Other Topics Concern  . Not on file   Social History Narrative  . No narrative on file     Family History  Problem Relation Age of Onset  . Heart failure Mother   . Hypertension Mother   . Brain cancer Father      Review of Systems: Review of Systems  Constitutional: Positive for malaise/fatigue. Negative for chills, diaphoresis, fever and weight loss.  HENT:  Negative for congestion.   Eyes: Negative for discharge and redness.  Respiratory: Negative for cough, hemoptysis, sputum production, shortness of breath and wheezing.   Cardiovascular: Positive for chest pain. Negative for palpitations, orthopnea, claudication, leg swelling and PND.  Gastrointestinal: Negative for abdominal pain, blood in stool, heartburn, melena, nausea and vomiting.  Genitourinary: Negative for hematuria.  Musculoskeletal: Negative for falls and myalgias.  Skin: Negative for rash.  Neurological: Negative for dizziness, tingling, tremors, sensory change, speech change, focal weakness, loss of consciousness, weakness and headaches.  Endo/Heme/Allergies: Does not bruise/bleed easily.  Psychiatric/Behavioral: Negative for substance abuse. The patient is not nervous/anxious.   All other systems reviewed and are negative.   Labs:  Recent Labs  06/05/16 2110 06/06/16 0020  TROPONINI 0.03* <0.03   Lab Results  Component Value Date   WBC 12.6 (H) 06/05/2016   HGB 12.6 (L) 06/05/2016   HCT 37.7 (L) 06/05/2016   MCV 85.7 06/05/2016   PLT 318 06/05/2016     Recent Labs Lab 06/06/16 0425  NA 138  K 3.5  CL 107  CO2 23  BUN 16  CREATININE 1.64*  CALCIUM 8.8*  GLUCOSE 171*   Lab Results  Component Value Date   CHOL 144 02/06/2013   HDL 34 (L) 02/06/2013   LDLCALC 86 02/06/2013   TRIG 120 02/06/2013   No results found for: DDIMER  Radiology/Studies:  Dg Chest 2 View  Result Date: 06/05/2016 CLINICAL DATA:  Intermittent chest pain, shortness of breath, productive cough EXAM: CHEST  2 VIEW COMPARISON:  12/04/2014 FINDINGS: Borderline cardiomegaly. No infiltrate or pleural effusion. No pulmonary edema. Mild perihilar bronchitic changes. IMPRESSION: Borderline cardiomegaly. Mild perihilar bronchitic changes. No infiltrate or pulmonary edema. Electronically Signed   By: Natasha Mead M.D.   On: 06/05/2016 21:42    EKG: Interpreted by me showed: sinus tachcyardia,  105 bpm, poor R wave progression, nonspecific st/t changes Telemetry: Interpreted by me showed: NSR, 80s bpm  Weights: [employer]   06/05/16 2102  Weight: 275 lb (124.7 kg)     Physical Exam: Blood pressure (!) 140/95, pulse 98, temperature 98.4 F (36.9 C), temperature source Oral, resp. rate (!) 28, height 5\' 7"  (1.702 m), weight 275 lb (124.7 kg), SpO2 95 %. Body mass index is 43.07 kg/m. General: Well developed, well nourished, in no acute distress. Multiple tattoos noted.  Head: Normocephalic, atraumatic, sclera non-icteric, no xanthomas, nares are without discharge.  Neck: Negative for carotid bruits. JVD not elevated. Lungs: Clear bilaterally to auscultation without wheezes, rales, or rhonchi. Breathing is unlabored. Heart: RRR with S1 S2. No murmurs, rubs, or gallops appreciated. Abdomen: Obese, soft, non-tender, non-distended with normoactive bowel sounds. No hepatomegaly. No rebound/guarding. No obvious abdominal  masses. Msk:  Strength and tone appear normal for age. Extremities: No clubbing or cyanosis. No edema. Distal pedal pulses are 2+ and equal bilaterally. Neuro: Alert and oriented X 3. No facial asymmetry. No focal deficit. Moves all extremities spontaneously. Psych:  Responds to questions appropriately with a normal affect.    Assessment and Plan:  Principal Problem:   Hypertensive urgency Active Problems:   H/O medication noncompliance   Type 2 diabetes mellitus with complication (HCC)   Obesity   Erectile dysfunction    1. Hypertensive urgency: -Improved currently -Almost certainly in the setting of medication noncompliance, not taking any medications for approximately 12 months secondary to financial issues and adverse effects with medications -If should be noted the patient has Medicaid can could get his medications for 3 dollars each though has numerous tattoos throughout as well as smokes tobacco -Case manager to discuss medications further and  assist with PCP placement -Start amlodipine 5 mg and titrate as needed -Restart lisinopril 20 mg daily, titrate as needed to 40 mg daily, will need follow up bmet in 1 week -Continue HCTZ 25 mg daily -Attempt to avoid hydralazine as in the past this has lead to palpitations with refractory tachycardia -Will avoid beta blocker for now given noncompliance with Coreg 2/2 ED. Could consider Bystolic, though affording this medication may be an issue for him -Recommend secondary HTN work up as an outpatient   2. Medication noncompliance presenting significant hazards to health: -Compliance advised -Adverse events discussed in detail  3. DM2: -Poorly controlled -Per IM  4. Obesity: -Weight loss advised  5. Erectile dysfunction: -Likely multifactorial including poorly controlled DM, poorly controlled HTN, morbid obesity and medication -Reports improvement when not taking Coreg -Discussed possible change to Bystolic -Needs outpatient follow up    Signed, Eula Listen, PA-C [facility] Pager: [phone] 06/07/2016, 12:57 PM

## 2016-06-07 NOTE — Progress Notes (Signed)
Discharge Instructions reviewed with patient. Patient reminded to take meds across the street to Medication management with Rx to for assistance with getting medications filled. Patient also reminded to follow up with Cardiologist and provided with Dietary counseling and handouts from the dietician prior to discharge. Per MD patient refusing PO metformin- Diabetes Coordinator made aware prior to discharge.  Patient verbalized understanding. All paperwork placed in discharge envelope. Vitals signs stable. Patient denies any pain at this time. IV dc'ed with cath intact, pressure applied and dry dressing applied once bleeding subsided.   Patient to leave the unit with wife via wheelchair, escorted to vehicle. All belongings including cell phone and charger sent with patient.

## 2016-06-07 NOTE — Discharge Instructions (Signed)
Follow-up with primary care physician at [facility] in 5-7 days Follow-up with [facility] cardiology in week, please consider repeating BMP Take medications as prescribed

## 2016-06-07 NOTE — Progress Notes (Signed)
Nutrition Brief Note  RD consulted for nutrition education regarding diabetes and hypertension.  Dietetic Intern provided "Carbohydrate Counting for People with Diabetes" and "Hypertension Nutrition Therapy" handouts from the Academy of [employer] and Dietetics.   Reviewed pt's dietary recall. Pt wakes up around noon, and does not eat until he get off at work that night, usually a big meal out. Pt consumes 2-3 bottles or 4 cans of soda a day. Pt frequently snacks on candy such a gummies.   Discussed different food groups and their effects on blood sugar, emphasizing carbohydrate-containing foods. Provided list of carbohydrates and recommended serving sizes of common foods.  Discussed importance of controlled and consistent carbohydrate intake throughout the day. Recommended small frequent meals to aid in maintaining blood sugars and satiation.  Discussed high salt content foods to avoid, focusing on fresh foods, eating processed and prepared foods less often, cooking more often at home, using salt-free seasonings, herbs, and spices to flavor foods, reading food labels, tracking daily sodium consumption, and using caution with condiments.   Teach back method used.  Expect fair to good compliance. Pt was asking related questions, stated readiness to change, stated he had intentionally been losing weight to be in better health for his significant other.  Body mass index is 43.2 kg/m Pt meets criteria for morbid obesity based on current BMI.  Current diet order is Heart Healthy/Carbohydrate modified, patient is consuming approximately 100% of meals at this time.   Labs and medications reviewed.   No further nutrition interventions warranted at this time. RD contact information provided. If additional nutrition issues arise, please re-consult RD.  Fransisca KaufmannAllison Ioannides Dietetic Intern

## 2016-06-07 NOTE — Progress Notes (Signed)
Pt AAO x4 denies pain or discomfort. Pt cont. To have elevated BP. Pt was given lopressor x 1 which was ineffective. Pt was transitioned to Cardene drip as ordered starting at 5mg  will cont.to monitor.

## 2016-06-07 NOTE — Discharge Summary (Signed)
[facility] [place] [facility]   PATIENT NAME: Carlos HahnJoshua [name]    MR#:  [id number]  DATE OF BIRTH:  1985-03-17  DATE OF ADMISSION:  06/05/2016 ADMITTING PHYSICIAN: Arnaldo NatalMichael S Diamond, MD  DATE OF DISCHARGE: 06/07/16  PRIMARY CARE PHYSICIAN: No PCP Per Patient    ADMISSION DIAGNOSIS:  Hypertensive urgency [I16.0] Chest pain, unspecified type [R07.9]  DISCHARGE DIAGNOSIS:  Hypertensive urgency Chest pain  SECONDARY DIAGNOSIS:   Past Medical History:  Diagnosis Date  . H/O medication noncompliance   . Hyperlipidemia   . Hypertension   . Kidney stones   . Lung nodule   . Nephrolithiasis   . Obesity   . Type 2 diabetes mellitus with complication (HCC) 06/07/2016    HOSPITAL COURSE:  HPI: The patient with past medical history of hypertension resents emergency department complaining of chest pain and shortness of breath. The patient states the onset of both was gradual but he has been having some dyspnea on exertion for some time. He is also not been taking his antihypertensive medication due to financial difficulty. Upon arrival to the emergency department the patient's blood pressure was greater than 200/140. He was given drowsing, labetalol and nitroglycerin paste that barely improved his pressure. He was then started on a nitroglycerin drip prior to the emergency department staff called the hospitalist service for admission.  Hospital course  1. Chest pain: Secondary to uncontrolled hypertension.  Acute MI ruled out with negative troponins Patient's blood pressure did not significantly improve with nitro drip we will wean off nitroglycerin and start him on nicardipine drip and titrate as needed Tylenol as needed for headache Cardiology consultation as needed  2. Hypertensive urgency: Troponin is negative but blood pressure is uncontrolled.  Weand off nitro drip as patient is did not respond well to that and blood pressure significantly improved  on nicardipine drip which is weaned off Held lisinopril in view of renal insufficiency , but lisinopril was resumed for renal protective effect in view of patient's diabetes mellitus , PCP to monitor renal function closely. Consider repeating BMP in a week during the follow-up visit Continue hydrochlorothiazide, amlodipine is added to the regimen and titrate as needed Coreg was discontinued secondary to ED Follow-up with cardiology in a week  #3 diabetes mellitus uncontrolled with hemoglobin A1c 8.2% Patient is noncompliant and stopped taking medications Refusing to take metformin he would rather try diabetic diet and lifestyle modifications Dietitian consulted for diabetic education   4 Morbid obesity: BMI is 43.2; encourage healthy diet and exercise  5. Noncompliance with the medications from financial issues Patient needs strict outpatient follow-up with a primary care physician, case management and social worker was consulted regarding [facility] and follow-up appointments reinforced the importance of being complicated with his medications   DVT prophylaxis: Heparin . GI prophylaxis: None DISCHARGE CONDITIONS:   fair  CONSULTS OBTAINED:  Treatment Team:  Yvonne Kendallhristopher End, MD   PROCEDURES  None   DRUG ALLERGIES:  No Known Allergies  DISCHARGE MEDICATIONS:   Current Discharge Medication List    START taking these medications   Details  amLODipine (NORVASC) 5 MG tablet Take 1 tablet (5 mg total) by mouth daily. Qty: 30 tablet, Refills: 0    hydrochlorothiazide (HYDRODIURIL) 25 MG tablet Take 1 tablet (25 mg total) by mouth daily. Qty: 30 tablet, Refills: 0    lisinopril (PRINIVIL,ZESTRIL) 20 MG tablet Take 1 tablet (20 mg total) by mouth daily. Qty: 30 tablet, Refills: 0  CONTINUE these medications which have NOT CHANGED   Details  aspirin 81 MG tablet Take 1 tablet (81 mg total) by mouth daily. Qty: 30 tablet, Refills: 6      STOP taking these medications      carvedilol (COREG) 12.5 MG tablet      lisinopril-hydrochlorothiazide (PRINZIDE,ZESTORETIC) 20-25 MG tablet          DISCHARGE INSTRUCTIONS:  Follow-up with primary care physician at [facility] [place] in 5-7 days Follow-up with [facility] cardiology in week, please consider repeating BMP Take medications as prescribed   DIET:  Cardiac diet, diabetic   DISCHARGE CONDITION:  Stable  ACTIVITY:  Activity as tolerated  OXYGEN:  Home Oxygen: No.   Oxygen Delivery: room air  DISCHARGE LOCATION:  home   If you experience worsening of your admission symptoms, develop shortness of breath, life threatening emergency, suicidal or homicidal thoughts you must seek medical attention immediately by calling 911 or calling your MD immediately  if symptoms less severe.  You Must read complete instructions/literature along with all the possible adverse reactions/side effects for all the Medicines you take and that have been prescribed to you. Take any new Medicines after you have completely understood and accpet all the possible adverse reactions/side effects.   Please note  You were cared for by a hospitalist during your hospital stay. If you have any questions about your discharge medications or the care you received while you were in the hospital after you are discharged, you can call the unit and asked to speak with the hospitalist on call if the hospitalist that took care of you is not available. Once you are discharged, your primary care physician will handle any further medical issues. Please note that NO REFILLS for any discharge medications will be authorized once you are discharged, as it is imperative that you return to your primary care physician (or establish a relationship with a primary care physician if you do not have one) for your aftercare needs so that they can reassess your need for medications and monitor your lab values.     Today  Chief  Complaint  Patient presents with  . Chest Pain  . Shortness of Breath   Patient is doing fine. Denies any chest pain or shortness of breath. Prefers trying diet and exercise to lose weight and control his diabetes, refusing to restart metformin but willing to take his blood pressure medications as long as they are not expensive  ROS:  CONSTITUTIONAL: Denies fevers, chills. Denies any fatigue, weakness.  EYES: Denies blurry vision, double vision, eye pain. EARS, NOSE, THROAT: Denies tinnitus, ear pain, hearing loss. RESPIRATORY: Denies cough, wheeze, shortness of breath.  CARDIOVASCULAR: Denies chest pain, palpitations, edema.  GASTROINTESTINAL: Denies nausea, vomiting, diarrhea, abdominal pain. Denies bright red blood per rectum. GENITOURINARY: Denies dysuria, hematuria. ENDOCRINE: Denies nocturia or thyroid problems. HEMATOLOGIC AND LYMPHATIC: Denies easy bruising or bleeding. SKIN: Denies rash or lesion. MUSCULOSKELETAL: Denies pain in neck, back, shoulder, knees, hips or arthritic symptoms.  NEUROLOGIC: Denies paralysis, paresthesias.  PSYCHIATRIC: Denies anxiety or depressive symptoms.   VITAL SIGNS:  Blood pressure (!) 140/95, pulse 98, temperature 98.4 F (36.9 C), temperature source Oral, resp. rate (!) 28, height 5\' 7"  (1.702 m), weight 124.7 kg (275 lb), SpO2 95 %.  I/O:    Intake/Output Summary (Last 24 hours) at 06/07/16 1353 Last data filed at 06/07/16 0951  Gross per 24 hour  Intake  823.36 ml  Output             1850 ml  Net         -1026.64 ml    PHYSICAL EXAMINATION:  GENERAL:  31 y.o.-year-old patient lying in the bed with no acute distress.  EYES: Pupils equal, round, reactive to light and accommodation. No scleral icterus. Extraocular muscles intact.  HEENT: Head atraumatic, normocephalic. Oropharynx and nasopharynx clear.  NECK:  Supple, no jugular venous distention. No thyroid enlargement, no tenderness.  LUNGS: Normal breath sounds  bilaterally, no wheezing, rales,rhonchi or crepitation. No use of accessory muscles of respiration.  CARDIOVASCULAR: S1, S2 normal. No murmurs, rubs, or gallops.  ABDOMEN: Soft, non-tender, non-distended. Bowel sounds present. No organomegaly or mass.  EXTREMITIES: No pedal edema, cyanosis, or clubbing.  NEUROLOGIC: Cranial nerves II through XII are intact. Muscle strength 5/5 in all extremities. Sensation intact. Gait not checked.  PSYCHIATRIC: The patient is alert and oriented x 3.  SKIN: No obvious rash, lesion, or ulcer.   DATA REVIEW:   CBC  Recent Labs Lab 06/05/16 2110  WBC 12.6*  HGB 12.6*  HCT 37.7*  PLT 318    Chemistries   Recent Labs Lab 06/06/16 0425  NA 138  K 3.5  CL 107  CO2 23  GLUCOSE 171*  BUN 16  CREATININE 1.64*  CALCIUM 8.8*    Cardiac Enzymes  Recent Labs Lab 06/06/16 0020  TROPONINI <0.03    Microbiology Results  Results for orders placed or performed during the hospital encounter of 06/05/16  MRSA PCR Screening     Status: None   Collection Time: 06/06/16  3:52 AM  Result Value Ref Range Status   MRSA by PCR NEGATIVE NEGATIVE Final    Comment:        The GeneXpert MRSA Assay (FDA approved for NASAL specimens only), is one component of a comprehensive MRSA colonization surveillance program. It is not intended to diagnose MRSA infection nor to guide or monitor treatment for MRSA infections.     RADIOLOGY:  Dg Chest 2 View  Result Date: 06/05/2016 CLINICAL DATA:  Intermittent chest pain, shortness of breath, productive cough EXAM: CHEST  2 VIEW COMPARISON:  12/04/2014 FINDINGS: Borderline cardiomegaly. No infiltrate or pleural effusion. No pulmonary edema. Mild perihilar bronchitic changes. IMPRESSION: Borderline cardiomegaly. Mild perihilar bronchitic changes. No infiltrate or pulmonary edema. Electronically Signed   By: Natasha Mead M.D.   On: 06/05/2016 21:42    EKG:   Orders placed or performed during the hospital  encounter of 06/05/16  . EKG 12-Lead  . EKG 12-Lead  . ED EKG within 10 minutes  . ED EKG within 10 minutes      Management plans discussed with the patient, family and they are in agreement.  CODE STATUS:     Code Status Orders        Start     Ordered   06/06/16 0356  Full code  Continuous     06/06/16 0355    Code Status History    Date Active Date Inactive Code Status Order ID Comments User Context   This patient has a current code status but no historical code status.      TOTAL TIME TAKING CARE OF THIS PATIENT: 45 minutes.   Note: This dictation was prepared with Dragon dictation along with smaller phrase technology. Any transcriptional errors that result from this process are unintentional.   @M[facility] @  on 06/07/2016 at 1:53 PM  Between 7am to  6pm - Pager - [phone]  After 6pm go to [email or web address] - password [email or web address] [facility]  [facility]  Office  [phone]  CC: Primary care physician; No PCP Per Patient

## 2016-06-07 NOTE — Progress Notes (Signed)
Dietician at bedside speaking with patient.

## 2016-06-07 NOTE — Progress Notes (Signed)
Pt is now sleeping resting well.Cardene drip has been titrated off due to BP within range. Will cont to monitor.

## 2016-06-07 NOTE — Progress Notes (Signed)
Inpatient Diabetes Program Recommendations  AACE/ADA: New Consensus Statement on Inpatient Glycemic Control (2015)  Target Ranges:  Prepandial:   less than 140 mg/dL      Peak postprandial:   less than 180 mg/dL (1-2 hours)      Critically ill patients:  140 - 180 mg/dL  Results for Carlos ShepherdJEFFREY, Carlos [name] (MRN [id number]) as of 06/07/2016 07:40  Ref. Range 06/05/2016 21:10 06/06/2016 04:25  Glucose Latest Ref Range: 65 - 99 mg/dL 782179 (H) 956171 (H)  Hemoglobin A1C Latest Ref Range: 4.8 - 5.6 %  8.2 (H)    Review of Glycemic Control  Diabetes history: DM2 Outpatient Diabetes medications: None Current orders for Inpatient glycemic control: None  Inpatient Diabetes Program Recommendations: Correction (SSI): While inpatient, please consider ordering CBGs with Novolog correction scale ACHS. HgbA1C: A1C 8.2% on 06/06/16 indicating an average glucose of 189 mg/dl over the past 2-3 months. Outpatient DM medication: Patient will need to be prescribed oral DM medication at time of discharge and follow up with PCP.  NOTE: In reviewing chart, noted patient has a documented history of DM2 and was previously on Metformin 1000 mg BID (per ED note on 12/10/14 by Dr. Derrill KayGoodman).  Thanks, Orlando PennerMarie Zahlia Deshazer, RN, MSN, CDE Diabetes Coordinator Inpatient Diabetes Program [phone] (Team Pager from 8am to 5pm)

## 2016-06-15 ENCOUNTER — Other Ambulatory Visit: Payer: Self-pay | Admitting: *Deleted

## 2016-06-15 ENCOUNTER — Telehealth: Payer: Self-pay | Admitting: *Deleted

## 2016-06-15 ENCOUNTER — Encounter: Payer: Self-pay | Admitting: Internal Medicine

## 2016-06-15 ENCOUNTER — Ambulatory Visit (INDEPENDENT_AMBULATORY_CARE_PROVIDER_SITE_OTHER): Payer: Self-pay | Admitting: Internal Medicine

## 2016-06-15 ENCOUNTER — Other Ambulatory Visit
Admission: RE | Admit: 2016-06-15 | Discharge: 2016-06-15 | Disposition: A | Discharge status: Home / Self Care | Payer: Self-pay | Source: Ambulatory Visit | Attending: Internal Medicine | Admitting: Internal Medicine

## 2016-06-15 VITALS — BP 142/102 | HR 85 | Ht 67.0 in | Wt 291.0 lb

## 2016-06-15 DIAGNOSIS — I10 Essential (primary) hypertension: Secondary | ICD-10-CM | POA: Insufficient documentation

## 2016-06-15 DIAGNOSIS — I119 Hypertensive heart disease without heart failure: Secondary | ICD-10-CM

## 2016-06-15 DIAGNOSIS — N183 Chronic kidney disease, stage 3 unspecified: Secondary | ICD-10-CM

## 2016-06-15 DIAGNOSIS — R7989 Other specified abnormal findings of blood chemistry: Secondary | ICD-10-CM

## 2016-06-15 DIAGNOSIS — E118 Type 2 diabetes mellitus with unspecified complications: Secondary | ICD-10-CM

## 2016-06-15 LAB — CKMB (ARMC ONLY): CK, MB: 2.2 ng/mL (ref 0.5–5.0)

## 2016-06-15 LAB — BASIC METABOLIC PANEL
ANION GAP: 9 (ref 5–15)
BUN: 23 mg/dL — AB (ref 6–20)
CO2: 26 mmol/L (ref 22–32)
Calcium: 9.4 mg/dL (ref 8.9–10.3)
Chloride: 102 mmol/L (ref 101–111)
Creatinine, Ser: 1.96 mg/dL — ABNORMAL HIGH (ref 0.61–1.24)
GFR calc Af Amer: 52 mL/min — ABNORMAL LOW (ref >60)
GFR, EST NON AFRICAN AMERICAN: 44 mL/min — AB (ref >60)
Glucose, Bld: 174 mg/dL — ABNORMAL HIGH (ref 65–99)
POTASSIUM: 4 mmol/L (ref 3.5–5.1)
SODIUM: 137 mmol/L (ref 135–145)

## 2016-06-15 MED ORDER — AMLODIPINE BESYLATE 10 MG PO TABS: 10.0000 mg | ORAL_TABLET | Freq: Every day | ORAL | 3 refills | Status: DC

## 2016-06-15 NOTE — Progress Notes (Signed)
Follow-up Outpatient Visit Date: 06/15/2016  Primary Care Provider: No PCP Per Patient No address on file  Chief Complaint: Follow-up hypertension  HPI:  Carlos [name] is a 31 y.o. year-old male with history of hypertension, diabetes mellitus, chronic kidney disease, and morbid obesity, who presents for follow-up after recent hospitalization for hypertensive emergency. He was admitted to [facility] on 06/05/16 in the setting of progressive dyspnea with a blood pressure greater than 200/140. At the time of admission, Carlos [name] reported that he had not been taking his blood pressure medications regularly for several months. He initially required IV nitroglycerin and nicardipine for blood pressure control but was subsequently transitioned to an oral regimen consisting of hydrochlorothiazide, lisinopril, and amlodipine. Since leaving the hospital, Carlos [name] has felt relatively well with the exception for some muscle soreness, particularly in his back. He has not had any shortness of breath, orthopnea, PND, edema, or chest pain. He notes occasional "bursts of energy" but denies palpitations or other accompanying symptoms. 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 However he does not exercise regularly. He has been trying to limit his sugar intake but brings a bottle of sweetened green tea with him to the visit today. He continues to smoke marijuana on a daily basis.  The patient's history is notable for hypertension reportedly beginning while he was still in high school. He denies previous workup for secondary hypertension.  --------------------------------------------------------------------------------------------------  Cardiovascular History & Procedures: Cardiovascular Problems:  Early onset hypertension  Risk Factors:  Hypertension, diabetes mellitus, morbid obesity, and sedentary lifestyle  Cath/PCI:  None  CV Surgery:  None  EP Procedures and  Devices:  None  Non-Invasive Evaluation(s):  Exercise tolerance test (05/23/13): Poor exercise capacity with hypertensive response. No significant ST-T changes. Patient exercised 5.5 minutes.  Recent CV Pertinent Labs: Lab Results  Component Value Date   CHOL 144 02/06/2013   HDL 34 (L) 02/06/2013   LDLCALC 86 02/06/2013   TRIG 120 02/06/2013   INR 1.0 05/12/2013   BNP 89.0 06/05/2016   K 3.5 06/06/2016   K 3.8 04/10/2014   BUN 16 06/06/2016   BUN 14 04/10/2014   CREATININE 1.64 (H) 06/06/2016   CREATININE 1.40 (H) 04/10/2014    Past medical and surgical history were reviewed and updated in EPIC.  Outpatient Encounter Prescriptions as of 06/15/2016  Medication Sig  . amLODipine (NORVASC) 5 MG tablet Take 1 tablet (5 mg total) by mouth daily.  Marland Kitchen aspirin 81 MG tablet Take 1 tablet (81 mg total) by mouth daily.  Marland Kitchen guaiFENesin (MUCINEX) 600 MG 12 hr tablet Take by mouth 2 (two) times daily.  . hydrochlorothiazide (HYDRODIURIL) 25 MG tablet Take 1 tablet (25 mg total) by mouth daily.  Marland Kitchen lisinopril (PRINIVIL,ZESTRIL) 20 MG tablet Take 1 tablet (20 mg total) by mouth daily.   No facility-administered encounter medications on file as of 06/15/2016.     Allergies: Patient has no known allergies.  Social History   Social History  . Marital status: Single    Spouse name: N/A  . Number of children: N/A  . Years of education: N/A   Occupational History  . Not on file.   Social History Main Topics  . Smoking status: Former Smoker    Packs/day: 0.50    Years: 1.00    Types: Cigarettes  . Smokeless tobacco: Never Used  . Alcohol use No     Comment: occasional  . Drug use: Yes    Types:  Marijuana  . Sexual activity: Not on file   Other Topics Concern  . Not on file   Social History Narrative  . No narrative on file    Family History  Problem Relation Age of Onset  . Heart failure Mother   . Hypertension Mother   . Lung cancer Mother   . Brain cancer Father   .  Lung cancer Father   . Diabetes Sister   . Autism Brother     Review of Systems: A 12-system review of systems was performed and was negative except as noted in the HPI.  --------------------------------------------------------------------------------------------------  Physical Exam: BP (!) 142/102 (BP Location: Right Arm, Patient Position: Sitting, Cuff Size: Normal)   Pulse 85   Ht  (1.702 m)   Wt 291 lb (132 kg)   BMI 45.58 kg/m   General:  Morbidly obese man, seated comfortably in the exam room. HEENT: No conjunctival pallor or scleral icterus.  Moist mucous membranes.  OP clear. Neck: Supple without lymphadenopathy, thyromegaly, JVD, or HJR. Lungs: Normal work of breathing.  Clear to auscultation bilaterally without wheezes or crackles. Heart: Regular rate and rhythm without murmurs, rubs, or gallops.  Non-displaced PMI. Abd: Bowel sounds present.  Soft, NT/ND without hepatosplenomegaly Ext: No lower extremity edema.  Radial, PT, and DP pulses are 2+ bilaterally. Skin: warm and dry without rash  EKG:  Normal sinus rhythm without significant abnormalities. Heart rate has decreased from prior tracing on 06/05/16. Criteria for septal infarct are no longer present (I have personally reviewed both tracings).  Lab Results  Component Value Date   WBC 12.6 (H) 06/05/2016   HGB 12.6 (L) 06/05/2016   HCT 37.7 (L) 06/05/2016   MCV 85.7 06/05/2016   PLT 318 06/05/2016    Lab Results  Component Value Date   NA 138 06/06/2016   K 3.5 06/06/2016   CL 107 06/06/2016   CO2 23 06/06/2016   BUN 16 06/06/2016   CREATININE 1.64 (H) 06/06/2016   GLUCOSE 171 (H) 06/06/2016   ALT 32 12/04/2014    Lab Results  Component Value Date   CHOL 144 02/06/2013   HDL 34 (L) 02/06/2013   LDLCALC 86 02/06/2013   TRIG 120 02/06/2013    --------------------------------------------------------------------------------------------------  ASSESSMENT AND PLAN: Hypertension Blood pressure  remains elevated, albeit better controlled than at the time of his recent hospitalization. I remain concerned for a secondary cause of the patient's blood pressure, given his young age. We have agreed to obtain renal artery Doppler ultrasound, as well as 24 hour urine collection for fractionated catecholamines and metanephrines, serum aldosterone, and plasma reading and activity. I will check a basic metabolic panel today to ensure that his renal function is stable with reinitiation of lisinopril. We will also obtain a transthoracic echocardiogram to evaluate for structural abnormalities secondary to his hypertension, particularly given recent admission for shortness of breath. Patient was advised to stop using marijuana.  Chronic kidney disease This is likely multifactorial including uncontrolled hypertension and untreated diabetes mellitus. We will repeat a basic metabolic panel today. I have encouraged the patient to establish with a primary care physician for long-term management of his kidney disease.  Diabetes mellitus Patient is not currently on therapy, nor does he follow with a primary care provider endocrinologist. Hemoglobin A1c was 8.2 at the time of admission last month. I encouraged him to watch his diet closely and to establish care with her PCP.  Follow-up: Return to clinic in 1 month.  Michaele Amundson,  MD 06/15/2016 1:26 PM

## 2016-06-15 NOTE — Telephone Encounter (Signed)
-----   Message from Yvonne Kendall, MD sent at 06/15/2016  1:31 PM EDT ----- Regarding: Echo Hi Jennifer,  Can we also set Mr. Bartnick up for an echo for hypertensive heart disease? Hopefully, we can do this at the same time as his renal artery doppler. Thanks.  Thayer Ohm

## 2016-06-15 NOTE — Patient Instructions (Addendum)
Medication Instructions:  Your physician has recommended you make the following change in your medication:  INCREASE amlodipine to  once daily   Labwork: BMET, Creatnine kinase, serum aldosterone, plasma renin, and 24 hour urine collection. - Please go to the [facility]. You will check in at the front desk to the right as you walk into the atrium. Valet Parking is offered if needed.  - You will be given a large container to collect your urine in for a 24 hour period. You will need to keep it on ice. You will be given further instructions from the lab.   Testing/Procedures: Your physician has requested that you have a renal artery duplex. During this test, an ultrasound is used to evaluate blood flow to the kidneys. Allow one hour for this exam. Do not eat after midnight the day before and avoid carbonated beverages. Take your medications as you usually do.    Follow-Up: Your physician recommends that you schedule a follow-up appointment in: one month with Dr. Okey Dupre.    If you need a refill on your cardiac medications before your next appointment, please call your pharmacy.

## 2016-06-15 NOTE — Telephone Encounter (Signed)
No answer. Left message to call back. Order entered for echo.

## 2016-06-17 LAB — ALDOSTERONE + RENIN ACTIVITY W/ RATIO
ALDO / PRA RATIO: 1 (ref 0.0–30.0)
ALDOSTERONE: 2.4 ng/dL (ref 0.0–30.0)
PRA LC/MS/MS: 2.515 ng/mL/h (ref 0.167–5.380)

## 2016-06-20 ENCOUNTER — Telehealth: Payer: Self-pay | Admitting: *Deleted

## 2016-06-20 ENCOUNTER — Other Ambulatory Visit: Payer: Self-pay | Admitting: *Deleted

## 2016-06-20 DIAGNOSIS — R079 Chest pain, unspecified: Secondary | ICD-10-CM

## 2016-06-20 DIAGNOSIS — I10 Essential (primary) hypertension: Secondary | ICD-10-CM

## 2016-06-20 NOTE — Telephone Encounter (Signed)
No answer. Left detailed message, ok per DPR, as a friendly reminder to get repeat lab work this Wednesday, June 22, 2016 and to make sure to picl up jug for 24 hour urine collections as well.

## 2016-06-20 NOTE — Telephone Encounter (Signed)
Appt for echo has been scheduled.

## 2016-06-22 ENCOUNTER — Other Ambulatory Visit
Admission: RE | Admit: 2016-06-22 | Discharge: 2016-06-22 | Disposition: A | Discharge status: Home / Self Care | Payer: Self-pay | Source: Ambulatory Visit | Attending: Internal Medicine | Admitting: Internal Medicine

## 2016-06-22 ENCOUNTER — Inpatient Hospital Stay: Admission: RE | Admit: 2016-06-22 | Payer: Self-pay | Source: Other Acute Inpatient Hospital

## 2016-06-22 ENCOUNTER — Telehealth: Payer: Self-pay | Admitting: Internal Medicine

## 2016-06-22 DIAGNOSIS — R079 Chest pain, unspecified: Secondary | ICD-10-CM

## 2016-06-22 DIAGNOSIS — I10 Essential (primary) hypertension: Secondary | ICD-10-CM

## 2016-06-22 LAB — BASIC METABOLIC PANEL
ANION GAP: 8 (ref 5–15)
BUN: 27 mg/dL — ABNORMAL HIGH (ref 6–20)
CHLORIDE: 104 mmol/L (ref 101–111)
CO2: 24 mmol/L (ref 22–32)
CREATININE: 1.54 mg/dL — AB (ref 0.61–1.24)
Calcium: 9.3 mg/dL (ref 8.9–10.3)
GFR calc non Af Amer: 60 mL/min — ABNORMAL LOW (ref >60)
Glucose, Bld: 189 mg/dL — ABNORMAL HIGH (ref 65–99)
POTASSIUM: 4.1 mmol/L (ref 3.5–5.1)
Sodium: 136 mmol/L (ref 135–145)

## 2016-06-22 NOTE — Progress Notes (Signed)
Please let Carlos [name] know that his renal function has returned to baseline. He should continue his current regimen of amlodipine and lisinopril. We will follow-up as planned.

## 2016-06-22 NOTE — Telephone Encounter (Signed)
Returned call and clarified with Lab that patient needed BMP today and to pick up supplies for the urine collection for the cetecholamines and metanephrines. She verbalized understanding and will cancel out the other testing they entered today as it was already done on 06/15/16.

## 2016-06-22 NOTE — Telephone Encounter (Signed)
[facility] needs clarification on pt lab order. Please call.

## 2016-06-23 ENCOUNTER — Other Ambulatory Visit
Admission: RE | Admit: 2016-06-23 | Discharge: 2016-06-23 | Disposition: A | Discharge status: Home / Self Care | Payer: Self-pay | Source: Ambulatory Visit | Attending: Internal Medicine | Admitting: Internal Medicine

## 2016-06-23 DIAGNOSIS — R7989 Other specified abnormal findings of blood chemistry: Secondary | ICD-10-CM | POA: Insufficient documentation

## 2016-06-23 DIAGNOSIS — I10 Essential (primary) hypertension: Secondary | ICD-10-CM | POA: Insufficient documentation

## 2016-06-29 LAB — CATECHOLAMINES,UR.,FREE,24 HR
DOPAMINE RANDOM UR: 174 ug/L
Dopamine, Ur, 24Hr: 261 ug/24 hr (ref 0–510)
EPINEPHRINE RAND UR: 2 ug/L
EPINEPHRINE, U, 24HR: 3 ug/(24.h) (ref 0–20)
NOREPINEPHRINE,U,24H: 21 ug/(24.h) (ref 0–135)
Norepinephrine, Rand Ur: 14 ug/L
Total Volume: 1500

## 2016-07-05 ENCOUNTER — Ambulatory Visit: Payer: Self-pay | Admitting: Pharmacy Technician

## 2016-07-05 DIAGNOSIS — Z79899 Other long term (current) drug therapy: Secondary | ICD-10-CM

## 2016-07-05 NOTE — Progress Notes (Signed)
Met with patient completed financial assistance application for [place] due to recent hospital visit.  Patient agreed to be responsible for gathering financial information and forwarding to appropriate department in [facility].    Completed [facility] application and contract.  Patient agreed to all terms of the [facility] contract.    Patient approved to receive medication assistance through 2018, as long as eligibility continues to be met.  Provided patient with community resource material based on her particular needs.    Referred patient to [facility].  [facility]

## 2016-07-20 ENCOUNTER — Other Ambulatory Visit: Payer: Self-pay | Admitting: *Deleted

## 2016-07-20 ENCOUNTER — Other Ambulatory Visit: Payer: Self-pay

## 2016-07-20 ENCOUNTER — Ambulatory Visit: Payer: Self-pay | Admitting: Internal Medicine

## 2016-07-20 MED ORDER — LISINOPRIL 20 MG PO TABS: 20.0000 mg | ORAL_TABLET | Freq: Every day | ORAL | 3 refills | Status: DC

## 2016-07-20 NOTE — Telephone Encounter (Signed)
Pt needs this sent to [facility]

## 2016-07-20 NOTE — Telephone Encounter (Signed)
Please review for refill. This patient was given Lisinopril in the hospital and wasn't sure if okay to refill by Dr. Okey DupreEnd.

## 2016-07-25 ENCOUNTER — Ambulatory Visit: Payer: Self-pay

## 2016-07-25 DIAGNOSIS — I119 Hypertensive heart disease without heart failure: Secondary | ICD-10-CM

## 2016-07-25 DIAGNOSIS — I10 Essential (primary) hypertension: Secondary | ICD-10-CM

## 2016-07-25 LAB — ECHOCARDIOGRAM COMPLETE
CHL CUP DOP CALC LVOT VTI: 15.1 cm
CHL CUP MV DEC (S): 169
CHL CUP RV SYS PRESS: 7 mmHg
CHL CUP TV REG PEAK VELOCITY: 103 cm/s
E decel time: 169 msec
E/e' ratio: 12.52
FS: 32 % (ref 28–44)
IVS/LV PW RATIO, ED: 0.83
LA vol A4C: 52 ml
LA vol index: 21.6 mL/m2
LADIAMINDEX: 1.77 cm/m2
LASIZE: 42 mm
LAVOL: 51.2 mL
LEFT ATRIUM END SYS DIAM: 42 mm
LV SIMPSON'S DISK: 59
LV TDI E'LATERAL: 7.71
LV TDI E'MEDIAL: 10.1
LV dias vol index: 68 mL/m2
LV sys vol index: 28 mL/m2
LV sys vol: 66 mL — AB (ref 21–61)
LVDIAVOL: 161 mL — AB (ref 62–150)
LVEEAVG: 12.52
LVEEMED: 12.52
LVELAT: 7.71 cm/s
LVOT SV: 57 mL
LVOT area: 3.8 cm2
LVOTD: 22 mm
Lateral S' vel: 14.2 cm/s
MV Peak grad: 4 mmHg
MV pk A vel: 70.8 m/s
MVPKEVEL: 96.5 m/s
P 1/2 time: 1139 ms
PW: 12 mm — AB (ref 0.6–1.1)
Stroke v: 95 ml
TAPSE: 22.1 mm
TRMAXVEL: 103 cm/s

## 2016-07-27 ENCOUNTER — Encounter: Payer: Self-pay | Admitting: Internal Medicine

## 2016-07-27 ENCOUNTER — Ambulatory Visit (INDEPENDENT_AMBULATORY_CARE_PROVIDER_SITE_OTHER): Payer: Self-pay | Admitting: Internal Medicine

## 2016-07-27 VITALS — BP 120/82 | HR 80 | Ht 66.0 in | Wt 291.0 lb

## 2016-07-27 DIAGNOSIS — E118 Type 2 diabetes mellitus with unspecified complications: Secondary | ICD-10-CM

## 2016-07-27 DIAGNOSIS — N183 Chronic kidney disease, stage 3 unspecified: Secondary | ICD-10-CM

## 2016-07-27 DIAGNOSIS — I10 Essential (primary) hypertension: Secondary | ICD-10-CM

## 2016-07-27 MED ORDER — AMLODIPINE BESYLATE 10 MG PO TABS: 10.0000 mg | ORAL_TABLET | Freq: Every day | ORAL | 3 refills | Status: DC

## 2016-07-27 NOTE — Patient Instructions (Signed)
Medication Instructions:  Your physician recommends that you continue on your current medications as directed. Please refer to the Current Medication list given to you today.   Labwork: none  Testing/Procedures: none  Follow-Up: Your physician recommends that you schedule a follow-up appointment in: 3 MONTHS WITH DR END.   If you need a refill on your cardiac medications before your next appointment, please call your pharmacy.   

## 2016-07-27 NOTE — Progress Notes (Signed)
Follow-up Outpatient Visit Date: 07/27/2016  Primary Care Provider: Patient, No Pcp Per No address on file  Chief Complaint: Follow-up hypertension  HPI:  [name] is a 31 y.o. year-old male with history of hypertension, diabetes mellitus, chronic kidney disease, and morbid obesity, who presents for follow-up of hypertension. He was admitted to [facility] on 06/05/16 in the setting of progressive dyspnea with a blood pressure greater than 200/140. At the time of admission, [name] reported that he had not been taking his blood pressure medications regularly for several months. He initially required IV nitroglycerin and nicardipine for blood pressure control but was subsequently transitioned to an oral regimen consisting of hydrochlorothiazide, lisinopril, and amlodipine. I saw him on 06/15/16, which time he was doing well though his blood pressure remains suboptimally controlled. We proceeded with workup for secondary hypertension, which has been negative. He reports taking his medications as prescribed, though he did run out of lisinopril about a week earlier this month. She reports feeling well without chest pain, shortness of breath, orthopnea, PND, and edema. He has taken up kayaking and is hopeful that he will be able to lose some weight. He does not check his blood pressure regularly at home. He has not had palpitations, lightheadedness, or headaches.   --------------------------------------------------------------------------------------------------  Cardiovascular History & Procedures: Cardiovascular Problems:  Early onset hypertension  Risk Factors:  Hypertension, diabetes mellitus, morbid obesity, and sedentary lifestyle  Cath/PCI:  None  CV Surgery:  None  EP Procedures and Devices:  None  Non-Invasive Evaluation(s):  TTE (07/25/16): Mildly dilated left ventricle with normal function (EF 60-65%). No wall motion abnormalities or diastolic dysfunction. Trivial aortic  regurgitation. Mild left atrial enlargement. Normal RV size and function.  Renal artery duplex (07/25/16): No significant renal artery stenosis.  Exercise tolerance test (05/23/13): Poor exercise capacity with hypertensive response. No significant ST-T changes. Patient exercised 5.5 minutes.  Recent CV Pertinent Labs: Lab Results  Component Value Date   CHOL 144 02/06/2013   HDL 34 (L) 02/06/2013   LDLCALC 86 02/06/2013   TRIG 120 02/06/2013   INR 1.0 05/12/2013   BNP 89.0 06/05/2016   K 4.1 06/22/2016   K 3.8 04/10/2014   BUN 27 (H) 06/22/2016   BUN 14 04/10/2014   CREATININE 1.54 (H) 06/22/2016   CREATININE 1.40 (H) 04/10/2014    Past medical and surgical history were reviewed and updated in EPIC.  Outpatient Encounter Prescriptions as of 07/27/2016  Medication Sig  . amLODipine (NORVASC) 10 MG tablet Take 1 tablet (10 mg total) by mouth daily.  Marland Kitchen aspirin 81 MG tablet Take 1 tablet (81 mg total) by mouth daily.  Marland Kitchen lisinopril (PRINIVIL,ZESTRIL) 20 MG tablet Take 1 tablet (20 mg total) by mouth daily.  . [DISCONTINUED] amLODipine (NORVASC) 10 MG tablet Take 1 tablet (10 mg total) by mouth daily.  . [DISCONTINUED] guaiFENesin (MUCINEX) 600 MG 12 hr tablet Take by mouth 2 (two) times daily.   No facility-administered encounter medications on file as of 07/27/2016.     Allergies: Patient has no known allergies.  Social History   Social History  . Marital status: Single    Spouse name: N/A  . Number of children: N/A  . Years of education: N/A   Occupational History  . Not on file.   Social History Main Topics  . Smoking status: Former Smoker    Packs/day: 0.50    Years: 1.00    Types: Cigarettes    Quit date: 2017  . Smokeless tobacco:  Never Used  . Alcohol use No     Comment: occasional  . Drug use: Yes    Frequency: 7.0 times per week    Types: Marijuana  . Sexual activity: Not on file   Other Topics Concern  . Not on file   Social History Narrative  . No  narrative on file    Family History  Problem Relation Age of Onset  . Heart failure Mother   . Hypertension Mother   . Lung cancer Mother   . Brain cancer Father   . Lung cancer Father   . Diabetes Sister   . Autism Brother     Review of Systems: A 12-system review of systems was performed and was negative except as noted in the HPI.  --------------------------------------------------------------------------------------------------  Physical Exam: BP 120/82 (BP Location: Left Arm, Patient Position: Sitting, Cuff Size: Large)   Pulse 80   Ht 5\' 6"  (1.676 m)   Wt 291 lb (132 kg)   BMI 46.97 kg/m   General:  Morbidly obese man, seated comfortably in the exam room. He is accompanied by his wife. HEENT: No conjunctival pallor or scleral icterus.  Moist mucous membranes.  OP clear. Neck: Supple without lymphadenopathy, thyromegaly, JVD, or HJR. Lungs: Clear bilaterally without wheezes or crackles. Normal breathing. Heart: Distant heart sounds. Regular rate and rhythm without murmurs, rubs, or gallops. Nondisplaced PMI. Abd: Bowel sounds present.  Soft, NT/ND. Unable to assess HSM due to body habitus. Ext: No lower extremity edema. 2+ radial, posterior tibial, dorsalis pedis pulses bilaterally. Skin: No rash. Warm and dry.  EKG:  Normal sinus rhythm without significant abnormalities. Heart rate has decreased from prior tracing on 06/05/16. Criteria for septal infarct are no longer present (I have personally reviewed both tracings).  Lab Results  Component Value Date   WBC 12.6 (H) 06/05/2016   HGB 12.6 (L) 06/05/2016   HCT 37.7 (L) 06/05/2016   MCV 85.7 06/05/2016   PLT 318 06/05/2016    Lab Results  Component Value Date   NA 136 06/22/2016   K 4.1 06/22/2016   CL 104 06/22/2016   CO2 24 06/22/2016   BUN 27 (H) 06/22/2016   CREATININE 1.54 (H) 06/22/2016   GLUCOSE 189 (H) 06/22/2016   ALT 32 12/04/2014    Lab Results  Component Value Date   CHOL 144 02/06/2013    HDL 34 (L) 02/06/2013   LDLCALC 86 02/06/2013   TRIG 120 02/06/2013   Serum aldosterone to plasma renin activity ratio: 1.0  --------------------------------------------------------------------------------------------------  ASSESSMENT AND PLAN: Hypertension [name] blood pressure is well controlled today. He is tolerating his current medication regimen well. On amlodipine and lisinopril at this time; HCTZ was previously held due to worsening renal insufficiency. We will not make any medication changes today. Secondary hypertension workup thus far has been unrevealing.  Chronic kidney disease I suspect this is due to a combination of factors, including long-standing hypertension and diabetes mellitus. Most recent creatinine was near the patient's baseline at 1.5. We will continue her current medication regimen. The patient reports that [facility]. I encouraged him to proceed with this, as it will be important for him to have primary care follow-up.  Diabetes mellitus [name] is currently not on any pharmacotherapy. I will defer this to his upcoming PCP visit later this month.  Follow-up: Return to clinic in 3 months.  Carlos Kendallhristopher Taressa Rauh, MD 07/29/2016 7:34 AM

## 2016-07-29 ENCOUNTER — Encounter: Payer: Self-pay | Admitting: Internal Medicine

## 2016-08-03 ENCOUNTER — Ambulatory Visit: Payer: Self-pay | Admitting: Internal Medicine

## 2016-09-07 ENCOUNTER — Ambulatory Visit: Payer: Self-pay | Admitting: Internal Medicine

## 2016-10-26 ENCOUNTER — Ambulatory Visit (INDEPENDENT_AMBULATORY_CARE_PROVIDER_SITE_OTHER): Payer: Self-pay | Admitting: Internal Medicine

## 2016-10-26 ENCOUNTER — Encounter: Payer: Self-pay | Admitting: Internal Medicine

## 2016-10-26 VITALS — BP 124/88 | HR 87 | Ht 67.0 in | Wt 286.5 lb

## 2016-10-26 DIAGNOSIS — I10 Essential (primary) hypertension: Secondary | ICD-10-CM

## 2016-10-26 DIAGNOSIS — F329 Major depressive disorder, single episode, unspecified: Secondary | ICD-10-CM

## 2016-10-26 DIAGNOSIS — F32A Depression, unspecified: Secondary | ICD-10-CM

## 2016-10-26 MED ORDER — ASPIRIN 81 MG PO TABS: 81.0000 mg | ORAL_TABLET | Freq: Every day | ORAL | 3 refills | Status: DC

## 2016-10-26 MED ORDER — LISINOPRIL 20 MG PO TABS: 20.0000 mg | ORAL_TABLET | Freq: Every day | ORAL | 3 refills | Status: DC

## 2016-10-26 MED ORDER — AMLODIPINE BESYLATE 10 MG PO TABS: 10.0000 mg | ORAL_TABLET | Freq: Every day | ORAL | 3 refills | Status: DC
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
## 2016-10-26 NOTE — Patient Instructions (Signed)
Medication Instructions:  Your physician recommends that you continue on your current medications as directed. Please refer to the Current Medication list given to you today.   Labwork: none  Testing/Procedures: none  Follow-Up: You have been [facility] to [facility].  Call for an appointment at [phone] if you do not receive a call in the next 3-5 days.  Your physician recommends that you schedule a follow-up appointment in: 3 MONTHS WITH DR END.   If you need a refill on your cardiac medications before your next appointment, please call your pharmacy.

## 2016-10-27 ENCOUNTER — Other Ambulatory Visit: Payer: Self-pay | Admitting: *Deleted

## 2016-10-27 DIAGNOSIS — F32A Depression, unspecified: Secondary | ICD-10-CM

## 2016-10-27 DIAGNOSIS — F329 Major depressive disorder, single episode, unspecified: Secondary | ICD-10-CM

## 2016-11-03 ENCOUNTER — Other Ambulatory Visit: Payer: Self-pay | Admitting: *Deleted

## 2016-11-03 ENCOUNTER — Telehealth: Payer: Self-pay | Admitting: *Deleted

## 2016-11-03 NOTE — Telephone Encounter (Signed)
Faxed demographics, office visit note to Dr Maryruth Bun for referral. Office staff stated Dr Maryruth Bun will review and then have her scheduling reach out to patient for an appt.

## 2016-11-07 NOTE — Telephone Encounter (Signed)
No answer. Left detail message with results, ok per DPR, and that referral was made to Dr Maryruth Bun and that he should receive a call in the next week to schedule an appt and to call me if he has any further questions.

## 2016-11-08 NOTE — Telephone Encounter (Signed)
No answer. Left detailed message ok per DPR that someone should be calling him to schedule an appt for referral and if he didn't get the call or had any questions to give me a call.

## 2017-01-23 ENCOUNTER — Encounter: Payer: Self-pay | Admitting: Psychiatry

## 2017-01-23 ENCOUNTER — Ambulatory Visit: Payer: Self-pay | Admitting: Psychiatry

## 2017-01-23 VITALS — BP 140/100 | HR 94 | Ht 66.0 in | Wt 286.0 lb

## 2017-01-23 DIAGNOSIS — F331 Major depressive disorder, recurrent, moderate: Secondary | ICD-10-CM

## 2017-01-23 DIAGNOSIS — F1220 Cannabis dependence, uncomplicated: Secondary | ICD-10-CM

## 2017-01-23 DIAGNOSIS — F4310 Post-traumatic stress disorder, unspecified: Secondary | ICD-10-CM

## 2017-01-23 DIAGNOSIS — F419 Anxiety disorder, unspecified: Secondary | ICD-10-CM

## 2017-01-23 MED ORDER — HYDROXYZINE PAMOATE 25 MG PO CAPS: 25.0000 mg | ORAL_CAPSULE | Freq: Three times a day (TID) | ORAL | 1 refills | Status: DC | PRN

## 2017-01-23 MED ORDER — DIVALPROEX SODIUM ER 250 MG PO TB24: 750.0000 mg | ORAL_TABLET | Freq: Every day | ORAL | 1 refills | Status: DC

## 2017-01-23 MED ORDER — BUPROPION HCL 75 MG PO TABS: 75.0000 mg | ORAL_TABLET | Freq: Every day | ORAL | 1 refills | Status: DC

## 2017-01-23 NOTE — Patient Instructions (Addendum)
Hydroxyzine capsules or tablets What is this medicine? HYDROXYZINE (hye DROX i zeen) is an antihistamine. This medicine is used to treat allergy symptoms. It is also used to treat anxiety and tension. This medicine can be used with other medicines to induce sleep before surgery. This medicine may be used for other purposes; ask your health care provider or pharmacist if you have questions. COMMON BRAND NAME(S): ANX, Atarax, Rezine, Vistaril What should I tell my health care provider before I take this medicine? They need to know if you have any of these conditions: -any chronic illness -difficulty passing urine -glaucoma -heart disease -kidney disease -liver disease -lung disease -an unusual or allergic reaction to hydroxyzine, cetirizine, other medicines, foods, dyes, or preservatives -pregnant or trying to get pregnant -breast-feeding How should I use this medicine? Take this medicine by mouth with a full glass of water. Follow the directions on the prescription label. You may take this medicine with food or on an empty stomach. Take your medicine at regular intervals. Do not take your medicine more often than directed. Talk to your pediatrician regarding the use of this medicine in children. Special care may be needed. While this drug may be prescribed for children as young as 6 years of age for selected conditions, precautions do apply. Patients over 65 years old may have a stronger reaction and need a smaller dose. Overdosage: If you think you have taken too much of this medicine contact a poison control center or emergency room at once. NOTE: This medicine is only for you. Do not share this medicine with others. What if I miss a dose? If you miss a dose, take it as soon as you can. If it is almost time for your next dose, take only that dose. Do not take double or extra doses. What may interact with this medicine? -alcohol -barbiturate medicines for sleep or seizures -medicines for  colds, allergies -medicines for depression, anxiety, or emotional disturbances -medicines for pain -medicines for sleep -muscle relaxants This list may not describe all possible interactions. Give your health care provider a list of all the medicines, herbs, non-prescription drugs, or dietary supplements you use. Also tell them if you smoke, drink alcohol, or use illegal drugs. Some items may interact with your medicine. What should I watch for while using this medicine? Tell your doctor or health care professional if your symptoms do not improve. You may get drowsy or dizzy. Do not drive, use machinery, or do anything that needs mental alertness until you know how this medicine affects you. Do not stand or sit up quickly, especially if you are an older patient. This reduces the risk of dizzy or fainting spells. Alcohol may interfere with the effect of this medicine. Avoid alcoholic drinks. Your mouth may get dry. Chewing sugarless gum or sucking hard candy, and drinking plenty of water may help. Contact your doctor if the problem does not go away or is severe. This medicine may cause dry eyes and blurred vision. If you wear contact lenses you may feel some discomfort. Lubricating drops may help. See your eye doctor if the problem does not go away or is severe. If you are receiving skin tests for allergies, tell your doctor you are using this medicine. What side effects may I notice from receiving this medicine? Side effects that you should report to your doctor or health care professional as soon as possible: -fast or irregular heartbeat -difficulty passing urine -seizures -slurred speech or confusion -tremor Side effects that   usually do not require medical attention (report to your doctor or health care professional if they continue or are bothersome): -constipation -drowsiness -fatigue -headache -stomach upset This list may not describe all possible side effects. Call your doctor for  medical advice about side effects. You may report side effects to FDA at 1-800-FDA-1088. Where should I keep my medicine? Keep out of the reach of children. Store at room temperature between 15 and 30 degrees C (59 and 86 degrees F). Keep container tightly closed. Throw away any unused medicine after the expiration date. NOTE: This sheet is a summary. It may not cover all possible information. If you have questions about this medicine, talk to your doctor, pharmacist, or health care provider.  2018 [employer]/Gold Standard (2007-07-13 14:50:59) Valproic Acid, Divalproex Sodium delayed or extended-release tablets What is this medicine? DIVALPROEX SODIUM (dye VAL pro ex SO dee um) is used to prevent seizures caused by some forms of epilepsy. It is also used to treat bipolar mania and to prevent migraine headaches. This medicine may be used for other purposes; ask your health care provider or pharmacist if you have questions. COMMON BRAND NAME(S): Depakote, Depakote ER What should I tell my health care provider before I take this medicine? They need to know if you have any of these conditions: -if you often drink alcohol -kidney disease -liver disease -low platelet counts -mitochondrial disease -suicidal thoughts, plans, or attempt; a previous suicide attempt by you or a family member -urea cycle disorder (UCD) -an unusual or allergic reaction to divalproex sodium, sodium valproate, valproic acid, other medicines, foods, dyes, or preservatives -pregnant or trying to get pregnant -breast-feeding How should I use this medicine? Take this medicine by mouth with a drink of water. Follow the directions on the prescription label. Do not cut, crush or chew this medicine. You can take it with or without food. If it upsets your stomach, take it with food. Take your medicine at regular intervals. Do not take it more often than directed. Do not stop taking except on your doctor's advice. A special MedGuide  will be given to you by the pharmacist with each prescription and refill. Be sure to read this information carefully each time. Talk to your pediatrician regarding the use of this medicine in children. While this drug may be prescribed for children as young as 10 years for selected conditions, precautions do apply. Overdosage: If you think you have taken too much of this medicine contact a poison control center or emergency room at once. NOTE: This medicine is only for you. Do not share this medicine with others. What if I miss a dose? If you miss a dose, take it as soon as you can. If it is almost time for your next dose, take only that dose. Do not take double or extra doses. What may interact with this medicine? Do not take this medicine with any of the following medications: -sodium phenylbutyrate This medicine may also interact with the following medications: -aspirin -certain antibiotics like ertapenem, imipenem, meropenem -certain medicines for depression, anxiety, or psychotic disturbances -certain medicines for seizures like carbamazepine, clonazepam, diazepam, ethosuximide, felbamate, lamotrigine, phenobarbital, phenytoin, primidone, rufinamide, topiramate -certain medicines that treat or prevent blood clots like warfarin -cholestyramine -male hormones, like estrogens and birth control pills, patches, or rings -propofol -rifampin -ritonavir -tolbutamide -zidovudine This list may not describe all possible interactions. Give your health care provider a list of all the medicines, herbs, non-prescription drugs, or dietary supplements you use. Also tell them  if you smoke, drink alcohol, or use illegal drugs. Some items may interact with your medicine. What should I watch for while using this medicine? Tell your doctor or healthcare professional if your symptoms do not get better or they start to get worse. Wear a medical ID bracelet or chain, and carry a card that describes your  disease and details of your medicine and dosage times. You may get drowsy, dizzy, or have blurred vision. Do not drive, use machinery, or do anything that needs mental alertness until you know how this medicine affects you. To reduce dizzy or fainting spells, do not sit or stand up quickly, especially if you are an older patient. Alcohol can increase drowsiness and dizziness. Avoid alcoholic drinks. This medicine can make you more sensitive to the sun. Keep out of the sun. If you cannot avoid being in the sun, wear protective clothing and use sunscreen. Do not use sun lamps or tanning beds/booths. Patients and their families should watch out for new or worsening depression or thoughts of suicide. Also watch out for sudden changes in feelings such as feeling anxious, agitated, panicky, irritable, hostile, aggressive, impulsive, severely restless, overly excited and hyperactive, or not being able to sleep. If this happens, especially at the beginning of treatment or after a change in dose, call your health care professional. Women should inform their doctor if they wish to become pregnant or think they might be pregnant. There is a potential for serious side effects to an unborn child. Talk to your health care professional or pharmacist for more information. Women who become pregnant while using this medicine may enroll in the [place] Antiepileptic Drug Pregnancy Registry by calling [phone]. This registry collects information about the safety of antiepileptic drug use during pregnancy. What side effects may I notice from receiving this medicine? Side effects that you should report to your doctor or health care professional as soon as possible: -allergic reactions like skin rash, itching or hives, swelling of the face, lips, or tongue -changes in vision -redness, blistering, peeling or loosening of the skin, including inside the mouth -signs and symptoms of liver injury like dark yellow or brown  urine; general ill feeling or flu-like symptoms; light-colored stools; loss of appetite; nausea; right upper belly pain; unusually weak or tired; yellowing of the eyes or skin -suicidal thoughts or other mood changes -unusual bleeding or bruising Side effects that usually do not require medical attention (report to your doctor or health care professional if they continue or are bothersome): -constipation -diarrhea -dizziness -hair loss -headache -loss of appetite -weight gain This list may not describe all possible side effects. Call your doctor for medical advice about side effects. You may report side effects to FDA at 1-800-FDA-1088. Where should I keep my medicine? Keep out of reach of children. Store at room temperature between 15 and 30 degrees C (59 and 86 degrees F). Keep container tightly closed. Throw away any unused medicine after the expiration date. NOTE: This sheet is a summary. It may not cover all possible information. If you have questions about this medicine, talk to your doctor, pharmacist, or health care provider.  2018 [employer]/Gold Standard (2015-06-04 07:11:40) Bupropion tablets (Depression/Mood Disorders) What is this medicine? BUPROPION (byoo PROE pee on) is used to treat depression. This medicine may be used for other purposes; ask your health care provider or pharmacist if you have questions. COMMON BRAND NAME(S): Wellbutrin What should I tell my health care provider before I take this medicine? They  need to know if you have any of these conditions: -an eating disorder, such as anorexia or bulimia -bipolar disorder or psychosis -diabetes or high blood sugar, treated with medication -glaucoma -heart disease, previous heart attack, or irregular heart beat -head injury or brain tumor -high blood pressure -kidney or liver disease -seizures -suicidal thoughts or a previous suicide attempt -Tourette's syndrome -weight loss -an unusual or allergic reaction to  bupropion, other medicines, foods, dyes, or preservatives -breast-feeding -pregnant or trying to become pregnant How should I use this medicine? Take this medicine by mouth with a glass of water. Follow the directions on the prescription label. You can take it with or without food. If it upsets your stomach, take it with food. Take your medicine at regular intervals. Do not take your medicine more often than directed. Do not stop taking this medicine suddenly except upon the advice of your doctor. Stopping this medicine too quickly may cause serious side effects or your condition may worsen. A special MedGuide will be given to you by the pharmacist with each prescription and refill. Be sure to read this information carefully each time. Talk to your pediatrician regarding the use of this medicine in children. Special care may be needed. Overdosage: If you think you have taken too much of this medicine contact a poison control center or emergency room at once. NOTE: This medicine is only for you. Do not share this medicine with others. What if I miss a dose? If you miss a dose, take it as soon as you can. If it is less than four hours to your next dose, take only that dose and skip the missed dose. Do not take double or extra doses. What may interact with this medicine? Do not take this medicine with any of the following medications: -linezolid -MAOIs like Azilect, Carbex, Eldepryl, Marplan, Nardil, and Parnate -methylene blue (injected into a vein) -other medicines that contain bupropion like Zyban This medicine may also interact with the following medications: -alcohol -certain medicines for anxiety or sleep -certain medicines for blood pressure like metoprolol, propranolol -certain medicines for depression or psychotic disturbances -certain medicines for HIV or AIDS like efavirenz, lopinavir, nelfinavir, ritonavir -certain medicines for irregular heart beat like propafenone,  flecainide -certain medicines for Parkinson's disease like amantadine, levodopa -certain medicines for seizures like carbamazepine, phenytoin, phenobarbital -cimetidine -clopidogrel -cyclophosphamide -digoxin -furazolidone -isoniazid -nicotine -orphenadrine -procarbazine -steroid medicines like prednisone or cortisone -stimulant medicines for attention disorders, weight loss, or to stay awake -tamoxifen -theophylline -thiotepa -ticlopidine -tramadol -warfarin This list may not describe all possible interactions. Give your health care provider a list of all the medicines, herbs, non-prescription drugs, or dietary supplements you use. Also tell them if you smoke, drink alcohol, or use illegal drugs. Some items may interact with your medicine. What should I watch for while using this medicine? Tell your doctor if your symptoms do not get better or if they get worse. Visit your doctor or health care professional for regular checks on your progress. Because it may take several weeks to see the full effects of this medicine, it is important to continue your treatment as prescribed by your doctor. Patients and their families should watch out for new or worsening thoughts of suicide or depression. Also watch out for sudden changes in feelings such as feeling anxious, agitated, panicky, irritable, hostile, aggressive, impulsive, severely restless, overly excited and hyperactive, or not being able to sleep. If this happens, especially at the beginning of treatment or after a change  in dose, call your health care professional. Avoid alcoholic drinks while taking this medicine. Drinking excessive alcoholic beverages, using sleeping or anxiety medicines, or quickly stopping the use of these agents while taking this medicine may increase your risk for a seizure. Do not drive or use heavy machinery until you know how this medicine affects you. This medicine can impair your ability to perform these  tasks. Do not take this medicine close to bedtime. It may prevent you from sleeping. Your mouth may get dry. Chewing sugarless gum or sucking hard candy, and drinking plenty of water may help. Contact your doctor if the problem does not go away or is severe. What side effects may I notice from receiving this medicine? Side effects that you should report to your doctor or health care professional as soon as possible: -allergic reactions like skin rash, itching or hives, swelling of the face, lips, or tongue -breathing problems -changes in vision -confusion -elevated mood, decreased need for sleep, racing thoughts, impulsive behavior -fast or irregular heartbeat -hallucinations, loss of contact with reality -increased blood pressure -redness, blistering, peeling or loosening of the skin, including inside the mouth -seizures -suicidal thoughts or other mood changes -unusually weak or tired -vomiting Side effects that usually do not require medical attention (report to your doctor or health care professional if they continue or are bothersome): -constipation -headache -loss of appetite -nausea -tremors -weight loss This list may not describe all possible side effects. Call your doctor for medical advice about side effects. You may report side effects to FDA at 1-800-FDA-1088. Where should I keep my medicine? Keep out of the reach of children. Store at room temperature between 20 and 25 degrees C (68 and 77 degrees F), away from direct sunlight and moisture. Keep tightly closed. Throw away any unused medicine after the expiration date. NOTE: This sheet is a summary. It may not cover all possible information. If you have questions about this medicine, talk to your doctor, pharmacist, or health care provider.  2018 [employer]vier/Gold Standard (2015-08-21 13:44:21)
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
## 2017-01-31 ENCOUNTER — Ambulatory Visit (INDEPENDENT_AMBULATORY_CARE_PROVIDER_SITE_OTHER): Payer: Self-pay | Admitting: Internal Medicine

## 2017-01-31 ENCOUNTER — Other Ambulatory Visit: Payer: Self-pay

## 2017-01-31 ENCOUNTER — Emergency Department
Admission: EM | Admit: 2017-01-31 | Discharge: 2017-01-31 | Disposition: A | Discharge status: Home / Self Care | Payer: Medicaid Other | Attending: Emergency Medicine | Admitting: Emergency Medicine

## 2017-01-31 ENCOUNTER — Encounter: Payer: Self-pay | Admitting: Internal Medicine

## 2017-01-31 ENCOUNTER — Emergency Department: Payer: Medicaid Other

## 2017-01-31 VITALS — BP 140/90 | HR 95 | Ht 66.0 in | Wt 280.5 lb

## 2017-01-31 DIAGNOSIS — A09 Infectious gastroenteritis and colitis, unspecified: Secondary | ICD-10-CM | POA: Insufficient documentation

## 2017-01-31 DIAGNOSIS — N183 Chronic kidney disease, stage 3 (moderate): Secondary | ICD-10-CM | POA: Insufficient documentation

## 2017-01-31 DIAGNOSIS — I129 Hypertensive chronic kidney disease with stage 1 through stage 4 chronic kidney disease, or unspecified chronic kidney disease: Secondary | ICD-10-CM | POA: Insufficient documentation

## 2017-01-31 DIAGNOSIS — E1122 Type 2 diabetes mellitus with diabetic chronic kidney disease: Secondary | ICD-10-CM | POA: Insufficient documentation

## 2017-01-31 DIAGNOSIS — I10 Essential (primary) hypertension: Secondary | ICD-10-CM

## 2017-01-31 DIAGNOSIS — Z87891 Personal history of nicotine dependence: Secondary | ICD-10-CM | POA: Insufficient documentation

## 2017-01-31 DIAGNOSIS — F32A Depression, unspecified: Secondary | ICD-10-CM | POA: Insufficient documentation

## 2017-01-31 DIAGNOSIS — R110 Nausea: Secondary | ICD-10-CM | POA: Insufficient documentation

## 2017-01-31 DIAGNOSIS — R197 Diarrhea, unspecified: Secondary | ICD-10-CM

## 2017-01-31 DIAGNOSIS — R1084 Generalized abdominal pain: Secondary | ICD-10-CM

## 2017-01-31 DIAGNOSIS — Z79899 Other long term (current) drug therapy: Secondary | ICD-10-CM | POA: Insufficient documentation

## 2017-01-31 DIAGNOSIS — F329 Major depressive disorder, single episode, unspecified: Secondary | ICD-10-CM

## 2017-01-31 LAB — COMPREHENSIVE METABOLIC PANEL
ALBUMIN: 4.1 g/dL (ref 3.5–5.0)
ALK PHOS: 96 U/L (ref 38–126)
ALT: 26 U/L (ref 17–63)
ANION GAP: 13 (ref 5–15)
AST: 24 U/L (ref 15–41)
BILIRUBIN TOTAL: 0.5 mg/dL (ref 0.3–1.2)
BUN: 12 mg/dL (ref 6–20)
CO2: 22 mmol/L (ref 22–32)
Calcium: 9.6 mg/dL (ref 8.9–10.3)
Chloride: 97 mmol/L — ABNORMAL LOW (ref 101–111)
Creatinine, Ser: 1.27 mg/dL — ABNORMAL HIGH (ref 0.61–1.24)
GFR calc Af Amer: >60 mL/min (ref >60)
GFR calc non Af Amer: >60 mL/min (ref >60)
GLUCOSE: 372 mg/dL — AB (ref 65–99)
POTASSIUM: 4 mmol/L (ref 3.5–5.1)
SODIUM: 132 mmol/L — AB (ref 135–145)
TOTAL PROTEIN: 7.7 g/dL (ref 6.5–8.1)

## 2017-01-31 LAB — CBC
HEMATOCRIT: 40.4 % (ref 40.0–52.0)
HEMOGLOBIN: 13.5 g/dL (ref 13.0–18.0)
MCH: 29.4 pg (ref 26.0–34.0)
MCHC: 33.4 g/dL (ref 32.0–36.0)
MCV: 87.9 fL (ref 80.0–100.0)
Platelets: 308 10*3/uL (ref 150–440)
RBC: 4.6 MIL/uL (ref 4.40–5.90)
RDW: 12.8 % (ref 11.5–14.5)
WBC: 12.9 10*3/uL — ABNORMAL HIGH (ref 3.8–10.6)

## 2017-01-31 LAB — URINALYSIS, COMPLETE (UACMP) WITH MICROSCOPIC
BACTERIA UA: NONE SEEN
Bilirubin Urine: NEGATIVE
Glucose, UA: >=500 mg/dL — AB
Hgb urine dipstick: NEGATIVE
Ketones, ur: 5 mg/dL — AB
Leukocytes, UA: NEGATIVE
NITRITE: NEGATIVE
PH: 6 (ref 5.0–8.0)
Protein, ur: NEGATIVE mg/dL
SPECIFIC GRAVITY, URINE: 1.026 (ref 1.005–1.030)

## 2017-01-31 LAB — LIPASE, BLOOD: Lipase: 26 U/L (ref 11–51)

## 2017-01-31 MED ORDER — ONDANSETRON 4 MG PO TBDP
4.0000 mg | ORAL_TABLET | Freq: Once | ORAL | Status: AC | PRN
  Administered 2017-01-31: 4 mg via ORAL
  Filled 2017-01-31: qty 1

## 2017-01-31 MED ORDER — CIPROFLOXACIN HCL 500 MG PO TABS
500.0000 mg | ORAL_TABLET | Freq: Once | ORAL | Status: AC
  Administered 2017-01-31: 500 mg via ORAL

## 2017-01-31 MED ORDER — ONDANSETRON HCL 4 MG/2ML IJ SOLN
4.0000 mg | Freq: Once | INTRAMUSCULAR | Status: AC
  Administered 2017-01-31: 4 mg via INTRAVENOUS
  Filled 2017-01-31: qty 2

## 2017-01-31 MED ORDER — METRONIDAZOLE 500 MG PO TABS: 500.0000 mg | ORAL_TABLET | Freq: Three times a day (TID) | ORAL | 0 refills | Status: AC

## 2017-01-31 MED ORDER — MORPHINE SULFATE (PF) 4 MG/ML IV SOLN
4.0000 mg | Freq: Once | INTRAVENOUS | Status: AC
  Administered 2017-01-31: 4 mg via INTRAVENOUS
  Filled 2017-01-31: qty 1

## 2017-01-31 MED ORDER — DICYCLOMINE HCL 20 MG PO TABS: 20.0000 mg | ORAL_TABLET | Freq: Three times a day (TID) | ORAL | 0 refills | Status: DC | PRN

## 2017-01-31 MED ORDER — CIPROFLOXACIN HCL 500 MG PO TABS: 500.0000 mg | ORAL_TABLET | Freq: Two times a day (BID) | ORAL | 0 refills | Status: AC

## 2017-01-31 MED ORDER — SODIUM CHLORIDE 0.9 % IV BOLUS (SEPSIS)
1000.0000 mL | Freq: Once | INTRAVENOUS | Status: AC
  Administered 2017-01-31: 1000 mL via INTRAVENOUS

## 2017-01-31 MED ORDER — METRONIDAZOLE 500 MG PO TABS
500.0000 mg | ORAL_TABLET | Freq: Once | ORAL | Status: AC
  Administered 2017-01-31: 500 mg via ORAL

## 2017-01-31 MED ORDER — IOPAMIDOL (ISOVUE-300) INJECTION 61%
125.0000 mL | Freq: Once | INTRAVENOUS | Status: AC | PRN
  Administered 2017-01-31: 125 mL via INTRAVENOUS
  Filled 2017-01-31: qty 150

## 2017-01-31 NOTE — ED Triage Notes (Signed)
Pt c/o generalized abd pain with N/V/D since last night.  

## 2017-01-31 NOTE — ED Notes (Signed)
Pt states that last night he had bad stomach pains. They come and go. States that when he coughs or dry heaves the pain is horrible.

## 2017-01-31 NOTE — Patient Instructions (Signed)
Medication Instructions:  Your physician recommends that you continue on your current medications as directed. Please refer to the Current Medication list given to you today.   Labwork: none  Testing/Procedures: none  Follow-Up: Your physician wants you to follow-up in: 6 months with Dr. Okey DupreEnd. You will receive a reminder letter in the mail two months in advance. If you don't receive a letter, please call our office to schedule the follow-up appointment.   Any Other Special Instructions Will Be Listed Below (If Applicable). Dr. Okey DupreEnd has recommended you proceed to the ER for evaluation of abdominal pain     If you need a refill on your cardiac medications before your next appointment, please call your pharmacy.

## 2017-01-31 NOTE — ED Provider Notes (Signed)
[facility] Emergency Department Provider Note  ____________________________________________   First MD Initiated Contact with Patient 01/31/17 1521     (approximate)  I have reviewed the triage vital signs and the nursing notes.   HISTORY  Chief Complaint Abdominal Pain   HPI Carlos [name] is a 31 y.o. male with history of kidney stones, diabetes and hypertension was presenting to the emergency department with diffuse abdominal pain which started last night.  He says that the pain is feeling like a cramping pain and is diffuse to the abdomen.  He says that it is coming and going and he has a nausea sensation with it but has not vomited.  He says that he has also had about 10 episodes of greenish diarrhea.  Denies any blood in his stool.  Says the pain is a 10 out of 10 at this time.  Says the pain does not radiate and does not feel like his kidney stones.  Denies any recent antibiotics or hospitalizations.  No known sick contacts.   Past Medical History:  Diagnosis Date  . H/O medication noncompliance   . Hyperlipidemia   . Hypertension   . Kidney stones   . Lung nodule   . Nephrolithiasis   . Obesity   . Type 2 diabetes mellitus with complication (HCC) 06/07/2016    Patient Active Problem List   Diagnosis Date Noted  . Generalized abdominal pain 01/31/2017  . Depression 01/31/2017  . Stage 3 chronic kidney disease (HCC) 06/15/2016  . H/O medication noncompliance 06/07/2016  . Obesity 06/07/2016  . Erectile dysfunction 06/07/2016  . Type 2 diabetes mellitus with complication (HCC) 06/07/2016  . Hypertensive urgency 06/06/2016  . Chest pain 05/23/2013  . SOB (shortness of breath) 05/23/2013  . Essential hypertension     Past Surgical History:  Procedure Laterality Date  . KIDNEY STONE SURGERY    . TONSILLECTOMY      Prior to Admission medications   Medication Sig Start Date End Date Taking? Authorizing Provider  amLODipine (NORVASC)  10 MG tablet Take 1 tablet (10 mg total) by mouth daily. 10/26/16 01/24/17  End, Cristal Deer, MD  buPROPion (WELLBUTRIN) 75 MG tablet Take 1 tablet (75 mg total) daily by mouth. 01/23/17   Jomarie Longs, MD  divalproex (DEPAKOTE ER) 250 MG 24 hr tablet Take 3 tablets (750 mg total) at bedtime by mouth. 01/23/17   Jomarie Longs, MD  hydrOXYzine (VISTARIL) 25 MG capsule Take 1 capsule (25 mg total) 3 (three) times daily as needed by mouth for anxiety (ALSO FOR INSOMNIA). 01/23/17   Jomarie Longs, MD  lisinopril (PRINIVIL,ZESTRIL) 20 MG tablet Take 1 tablet (20 mg total) by mouth daily. 10/26/16   End, Cristal Deer, MD  Melatonin 5 MG CAPS Take 10 mg at bedtime by mouth.    [provider]    Allergies Patient has no known allergies.  Family History  Problem Relation Age of Onset  . Heart failure Mother   . Hypertension Mother   . Lung cancer Mother   . Brain cancer Father   . Lung cancer Father   . Diabetes Sister   . Autism Brother     Social History Social History   Tobacco Use  . Smoking status: Former Smoker    Packs/day: 0.50    Years: 1.00    Pack years: 0.50    Types: Cigarettes    Last attempt to quit: 2017    Years since quitting: 1.8  . Smokeless tobacco: Never  Used  Substance Use Topics  . Alcohol use: No    Comment: occasional  . Drug use: Yes    Frequency: 7.0 times per week    Types: Marijuana    Review of Systems  Constitutional: No fever/chills Eyes: No visual changes. ENT: No sore throat. Cardiovascular: Denies chest pain. Respiratory: Denies shortness of breath. Gastrointestinal:  no vomiting.   No constipation. Genitourinary: Negative for dysuria. Musculoskeletal: Negative for back pain. Skin: Negative for rash. Neurological: Negative for headaches, focal weakness or numbness.   ____________________________________________   PHYSICAL EXAM:  VITAL SIGNS: ED Triage Vitals [01/31/17 1500]  Enc Vitals Group     BP 134/81      Pulse Rate (!) 103     Resp 17     Temp 97.9 F (36.6 C)     Temp Source Oral     SpO2 96 %     Weight 280 lb (127 kg)     Height 5\' 6"  (1.676 m)     Head Circumference      Peak Flow      Pain Score 2     Pain Loc      Pain Edu?      Excl. in GC?     Constitutional: Alert and oriented. Patient sitting on the side of the bed went into the room and holding his abdomen.  Appears uncomfortable. Eyes: Conjunctivae are normal.  Head: Atraumatic. Nose: No congestion/rhinnorhea. Mouth/Throat: Mucous membranes are moist.  Neck: No stridor.   Cardiovascular: Normal rate, regular rhythm. Grossly normal heart sounds.  Good peripheral circulation. Respiratory: Normal respiratory effort.  No retractions. Lungs CTAB. Gastrointestinal: Soft with focal right lower quadrant moderate tenderness to palpation without rebound or guarding. No distention. No CVA tenderness. Musculoskeletal: No lower extremity tenderness nor edema.  No joint effusions. Neurologic:  Normal speech and language. No gross focal neurologic deficits are appreciated. Skin:  Skin is warm, dry and intact. No rash noted. Psychiatric: Mood and affect are normal. Speech and behavior are normal.  ____________________________________________   LABS (all labs ordered are listed, but only abnormal results are displayed)  Labs Reviewed  COMPREHENSIVE METABOLIC PANEL - Abnormal; Notable for the following components:      Result Value   Sodium 132 (*)    Chloride 97 (*)    Glucose, Bld 372 (*)    Creatinine, Ser 1.27 (*)    All other components within normal limits  CBC - Abnormal; Notable for the following components:   WBC 12.9 (*)    All other components within normal limits  URINALYSIS, COMPLETE (UACMP) WITH MICROSCOPIC - Abnormal; Notable for the following components:   Color, Urine YELLOW (*)    APPearance CLEAR (*)    Glucose, UA >=500 (*)    Ketones, ur 5 (*)    Squamous Epithelial / LPF 0-5 (*)    All other  components within normal limits  LIPASE, BLOOD   ____________________________________________  EKG   ____________________________________________  RADIOLOGY  Thickened and inflamed terminal ileum and distal small bowel loops with loop irregularity.  Possible mild involvement of the cecum but the appendix is normal. ____________________________________________   PROCEDURES  Procedure(s) performed:   Procedures  Critical Care performed:   ____________________________________________   INITIAL IMPRESSION / ASSESSMENT AND PLAN / ED COURSE  Pertinent labs & imaging results that were available during my care of the patient were reviewed by me and considered in my medical decision making (see chart for details).  Differential diagnosis  includes, but is not limited to, acute appendicitis, renal colic, testicular torsion, urinary tract infection/pyelonephritis, prostatitis,  epididymitis, diverticulitis, small bowel obstruction or ileus, colitis, abdominal aortic aneurysm, gastroenteritis, hernia, etc.  As part of my medical decision making, I reviewed the following data within the electronic MEDICAL RECORD NUMBER Notes from prior ED visits   ----------------------------------------- 5:35 PM on 01/31/2017 -----------------------------------------  Patient pain improved with morphine.  No vomiting.  Concerning findings with differential including infectious enteritis versus Crohn's disease.  Patient benign in appearance at this time though.  Patient will be discharged with Cipro as well as Flagyl and Bentyl.  He will be given follow-up with gastroenterology.  We discussed the imaging as well as the possibilities for the diagnosis and the need for follow-up and possible colonoscopy for further diagnosis.  The patient is understanding and willing to comply.  He also works in Personnel officerfood service and I discussed with him discussing with his boss not to return until his diarrhea has resolved.  He is  understanding and willing to comply.  Also knows to return to the emergency department for any worsening or concerning symptoms.     ____________________________________________   FINAL CLINICAL IMPRESSION(S) / ED DIAGNOSES  Abdominal pain.  Infectious enteritis.  Colitis.    NEW MEDICATIONS STARTED DURING THIS VISIT:  This SmartLink is deprecated. Use AVSMEDLIST instead to display the medication list for a patient.   Note:  This document was prepared using Dragon voice recognition software and may include unintentional dictation errors.     Myrna BlazerSchaevitz, Jeray Shugart Matthew, MD 01/31/17 [phone]

## 2017-01-31 NOTE — Progress Notes (Signed)
Follow-up Outpatient Visit Date: 01/31/2017  Primary Care Provider: Patient, No Pcp Per No address on file  Chief Complaint: Abdominal pain  HPI:  [name] is a 31 y.o. year-old male with history of  hypertension, diabetes mellitus, chronic kidney disease, and morbid obesity, who presents for follow-up of hypertension.  He notes that his blood pressure has generally been well controlled at home.  He is tolerating lisinopril and amlodipine well.  He denies chest pain, shortness of breath, palpitations, orthopnea, and edema.  Carlos [name]'s chief concern today is abdominal pain.  This developed last night and has been accompanied by emesis and diarrhea.  He has not had any bloody bowel movements or hematemesis.  He describes the abdominal pain as a "vice" gripping around his bellybutton and radiating to the right lower quadrant.  He has not had pain like this before.  He denies fevers and chills as well as recent sick contacts.  He does not have a history of prior abdominal surgery.  Since her last visit, [name] has been evaluated by psychiatry and started on medications.  He notes some improvement in his mood.  He is also scheduled to be seen by a therapist.  --------------------------------------------------------------------------------------------------  Cardiovascular History & Procedures: Cardiovascular Problems:  Early onset hypertension  Risk Factors:  Hypertension, diabetes mellitus, morbid obesity, and sedentary lifestyle  Cath/PCI:  None  CV Surgery:  None  EP Procedures and Devices:  None  Non-Invasive Evaluation(s):  TTE (07/25/16): Mildly dilated left ventricle with normal function (EF 60-65%). No wall motion abnormalities or diastolic dysfunction. Trivial aortic regurgitation. Mild left atrial enlargement. Normal RV size and function.  Renal artery duplex (07/25/16): No significant renal artery stenosis.  Exercise tolerance test (05/23/13): Poor  exercise capacity with hypertensive response. No significant ST-T changes. Patient exercised 5.5 minutes.  Recent CV Pertinent Labs:  Recent CV Pertinent Labs: Lab Results  Component Value Date   CHOL 144 02/06/2013   HDL 34 (L) 02/06/2013   LDLCALC 86 02/06/2013   TRIG 120 02/06/2013   INR 1.0 05/12/2013   BNP 89.0 06/05/2016   K 4.1 06/22/2016   K 3.8 04/10/2014   BUN 27 (H) 06/22/2016   BUN 14 04/10/2014   CREATININE 1.54 (H) 06/22/2016   CREATININE 1.40 (H) 04/10/2014    Past medical and surgical history were reviewed and updated in EPIC.  Current Meds  Medication Sig  . buPROPion (WELLBUTRIN) 75 MG tablet Take 1 tablet (75 mg total) daily by mouth.  . divalproex (DEPAKOTE ER) 250 MG 24 hr tablet Take 3 tablets (750 mg total) at bedtime by mouth.  . hydrOXYzine (VISTARIL) 25 MG capsule Take 1 capsule (25 mg total) 3 (three) times daily as needed by mouth for anxiety (ALSO FOR INSOMNIA).  Marland Kitchen. lisinopril (PRINIVIL,ZESTRIL) 20 MG tablet Take 1 tablet (20 mg total) by mouth daily.  . Melatonin 5 MG CAPS Take 10 mg at bedtime by mouth.    Allergies: Patient has no known allergies.  Social History   Socioeconomic History  . Marital status: Single    Spouse name: Not on file  . Number of children: Not on file  . Years of education: Not on file  . Highest education level: Not on file  Social Needs  . Financial resource strain: Not on file  . Food insecurity - worry: Not on file  . Food insecurity - inability: Not on file  . Transportation needs - medical: Not on file  . Transportation needs - non-medical:  Not on file  Occupational History  . Not on file  Tobacco Use  . Smoking status: Former Smoker    Packs/day: 0.50    Years: 1.00    Pack years: 0.50    Types: Cigarettes    Last attempt to quit: 2017    Years since quitting: 1.8  . Smokeless tobacco: Never Used  Substance and Sexual Activity  . Alcohol use: No    Comment: occasional  . Drug use: Yes     Frequency: 7.0 times per week    Types: Marijuana  . Sexual activity: Yes    Partners: Female    Birth control/protection: None  Other Topics Concern  . Not on file  Social History Narrative  . Not on file    Family History  Problem Relation Age of Onset  . Heart failure Mother   . Hypertension Mother   . Lung cancer Mother   . Brain cancer Father   . Lung cancer Father   . Diabetes Sister   . Autism Brother     Review of Systems: A 12-system review of systems was performed and was negative except as noted in the HPI.  --------------------------------------------------------------------------------------------------  Physical Exam: BP 140/90 (BP Location: Left Arm, Patient Position: Sitting, Cuff Size: Normal)   Pulse 95   Ht 5\' 6"  (1.676 m)   Wt 280 lb 8 oz (127.2 kg)   BMI 45.27 kg/m   General: Morbidly obese man, seated comfortably in the exam room. HEENT: No conjunctival pallor or scleral icterus. Moist mucous membranes.  OP clear. Neck: Supple without lymphadenopathy, thyromegaly, JVD, or HJR. Lungs: Normal work of breathing. Clear to auscultation bilaterally without wheezes or crackles. Heart: Regular rate and rhythm without murmurs, rubs, or gallops.  He was to assess PMI due to body habitus. Abd: Active bowel sounds.  Soft with mild diffuse tenderness, most pronounced in the right lower quadrant.  Rebound is present.  No hepatomegaly. Ext: No lower extremity edema. Radial, PT, and DP pulses are 2+ bilaterally. Skin: Warm and dry without rash.  EKG: Sinus rhythm with borderline left atrial enlargement.  Lab Results  Component Value Date   WBC 12.6 (H) 06/05/2016   HGB 12.6 (L) 06/05/2016   HCT 37.7 (L) 06/05/2016   MCV 85.7 06/05/2016   PLT 318 06/05/2016    Lab Results  Component Value Date   NA 136 06/22/2016   K 4.1 06/22/2016   CL 104 06/22/2016   CO2 24 06/22/2016   BUN 27 (H) 06/22/2016   CREATININE 1.54 (H) 06/22/2016   GLUCOSE 189 (H)  06/22/2016   ALT 32 12/04/2014    Lab Results  Component Value Date   CHOL 144 02/06/2013   HDL 34 (L) 02/06/2013   LDLCALC 86 02/06/2013   TRIG 120 02/06/2013    --------------------------------------------------------------------------------------------------  ASSESSMENT AND PLAN: Abdominal pain Diffuse abdominal pain, though tenderness is most pronounced in the right lower quadrant.  Given presence of some rebound on exam today, I will refer him to the emergency department for further evaluation, including exclusion of appendicitis.  Essential hypertension Blood pressure is borderline elevated today, though this may be driven by acute pain.  Prior readings have been normal.  We will continue current doses of lisinopril and amlodipine.  Depression Better controlled with recent psychiatry evaluation.  I will defer further management to his psychiatrist and therapist.  Follow-up: Return to clinic in 6 months.  Yvonne Kendallhristopher Kendel Bessey, MD 01/31/2017 2:11 PM

## 2017-02-09 ENCOUNTER — Telehealth: Payer: Self-pay | Admitting: Internal Medicine

## 2017-02-09 NOTE — Telephone Encounter (Signed)
Received records request Disability Determination Services, forwarded to [employer] for processing.  

## 2017-02-16 ENCOUNTER — Encounter: Payer: Self-pay | Admitting: Gastroenterology

## 2017-02-16 ENCOUNTER — Ambulatory Visit: Payer: Medicaid Other | Admitting: Gastroenterology

## 2017-02-16 ENCOUNTER — Other Ambulatory Visit: Payer: Self-pay

## 2017-02-16 VITALS — BP 136/84 | HR 91 | Temp 97.7°F | Ht 66.0 in | Wt 281.4 lb

## 2017-02-16 DIAGNOSIS — R935 Abnormal findings on diagnostic imaging of other abdominal regions, including retroperitoneum: Secondary | ICD-10-CM

## 2017-02-16 DIAGNOSIS — R197 Diarrhea, unspecified: Secondary | ICD-10-CM

## 2017-02-16 NOTE — Progress Notes (Signed)
Cephas Darby, MD [address]  [place]  [place], [place] [address]  Main: [phone]  Fax: [phone]    Gastroenterology Consultation  Referring Provider:     No ref. provider found Primary Care Physician:  Patient, No Pcp Per Primary Gastroenterologist:  Dr. Cephas Darby Reason for Consultation:   Abdominal pain, nausea, vomiting        HPI:   Carlos [name] is a 31 y.o. male referred from ER for consultation & management of acute enteritis. He went to ER on 01/31/17 with 1 day h/o diffuse abdominal pain a/w nausea, and several episodes of non-bloody diarrhea. He had mildly elevated leukocytosis, lipase and LFTs were normal. He had CT which revealed Thickened and inflamed terminal ileum and distal small bowel loops with loop irregularity. Possible mild involvement of the cecum, but the appendix is normal. He is discharged home on cipro and flagyl for 10days, and bentyl. He reports that his symptoms have mostly resolved, continues to have 4-5 loose, non bloody BMs daily. Not a/w urgency, rectal bleeding, abdominal cramps, nausea, vomiting, fever.  He denies eating outside food, antibiotic use, NSAIDs or sick contacts or recent travel.  He denies having similar episodes in the past.  Prior to this episode, patient reported having regular bowel movements without any abdominal pain, cramps.  Has occasional constipation.  He does experience intermittent episodes of significant bloating and nausea.  He has history of metabolic syndrome, nephrolithiasis status post ureteral stent placement, CKD.  He is intentionally trying to lose weight, lost about 80 pounds.  He does have strong family history of metabolic syndrome and heart disease. He denies any GI surgeries  NSAIDs: None  Antiplts/Anticoagulants/Anti thrombotics: None  GI Procedures: none  He smokes marijuana, denies smoking cigarettes Denies drinking alcohol He denies family history of inflammatory bowel disease  or GI malignancy  Past Medical History:  Diagnosis Date  . H/O medication noncompliance   . Hyperlipidemia   . Hypertension   . Kidney stones   . Lung nodule   . Nephrolithiasis   . Obesity   . Type 2 diabetes mellitus with complication ([place]) 06/29/4079    Past Surgical History:  Procedure Laterality Date  . KIDNEY STONE SURGERY    . TONSILLECTOMY      Current Outpatient Medications:  .  buPROPion (WELLBUTRIN) 75 MG tablet, Take 1 tablet (75 mg total) daily by mouth., Disp: 30 tablet, Rfl: 1 .  dicyclomine (BENTYL) 20 MG tablet, Take 1 tablet (20 mg total) by mouth 3 (three) times daily as needed for spasms., Disp: 30 tablet, Rfl: 0 .  divalproex (DEPAKOTE ER) 250 MG 24 hr tablet, Take 3 tablets (750 mg total) at bedtime by mouth., Disp: 90 tablet, Rfl: 1 .  lisinopril (PRINIVIL,ZESTRIL) 20 MG tablet, Take 1 tablet (20 mg total) by mouth daily., Disp: 90 tablet, Rfl: 3 .  amLODipine (NORVASC) 10 MG tablet, Take 1 tablet (10 mg total) by mouth daily., Disp: 90 tablet, Rfl: 3 .  hydrOXYzine (VISTARIL) 25 MG capsule, Take 1 capsule (25 mg total) 3 (three) times daily as needed by mouth for anxiety (ALSO FOR INSOMNIA). (Patient not taking: Reported on 02/16/2017), Disp: 90 capsule, Rfl: 1   Family History  Problem Relation Age of Onset  . Heart failure Mother   . Hypertension Mother   . Lung cancer Mother   . Brain cancer Father   . Lung cancer Father   . Diabetes Sister   . Autism Brother  Social History   Tobacco Use  . Smoking status: Former Smoker    Packs/day: 0.50    Years: 1.00    Pack years: 0.50    Types: Cigarettes    Last attempt to quit: 2017    Years since quitting: 1.9  . Smokeless tobacco: Never Used  Substance Use Topics  . Alcohol use: No    Comment: occasional  . Drug use: Yes    Frequency: 7.0 times per week    Types: Marijuana    Allergies as of 02/16/2017  . (No Known Allergies)    Review of Systems:    All systems reviewed and  negative except where noted in HPI.   Physical Exam:  BP 136/84   Pulse 91   Temp 97.7 F (36.5 C) (Oral)   Ht '5\' 6"'  (1.676 m)   Wt 281 lb 6.4 oz (127.6 kg)   BMI 45.42 kg/m  No LMP for male patient.  General:   Alert, obese, pleasant and cooperative in NAD Head:  Normocephalic and atraumatic. Eyes:  Sclera clear, no icterus.   Conjunctiva pink. Ears:  Normal auditory acuity. Nose:  No deformity, discharge, or lesions. Mouth:  No deformity or lesions,oropharynx pink & moist. Neck:  Supple; no masses or thyromegaly. Lungs:  Respirations even and unlabored.  Clear throughout to auscultation.   No wheezes, crackles, or rhonchi. No acute distress. Heart:  Regular rate and rhythm; no murmurs, clicks, rubs, or gallops. Abdomen:  Normal bowel sounds. Soft, redundant skin and abdomen, mild right lower quadrant tenderness and non-distended without masses, hepatosplenomegaly or hernias noted.  No guarding or rebound tenderness.   Rectal: Nor performed Msk:  Symmetrical without gross deformities. Good, equal movement & strength bilaterally. Pulses:  Normal pulses noted. Extremities:  No clubbing or edema.  No cyanosis. Neurologic:  Alert and oriented x3;  grossly normal neurologically. Skin:  Intact without significant lesions or rashes. No jaundice, redundant skin in his trunk and upper extremities secondary to significant weight loss Lymph Nodes:  No significant cervical adenopathy. Psych:  Alert and cooperative. Normal mood and affect.  Imaging Studies: Reviewed  Assessment and Plan:   Carlos [name] is a 31 y.o. Caucasian male with acute episode of nonbloody diarrhea and abdominal pain, CT revealing thickening of the terminal ileum and distal ileum with dilated small bowel loops.  His symptoms have currently improved with Cipro and Flagyl.  Differentials include acute viral or bacterial enteritis or de novo Crohn's disease or TB ileitis.  HIV nonreactive  -Stool studies for C.  difficile and other GI pathogens -Check ESR, CRP -Check fecal calprotectin -Discussed about colonoscopy with TI evaluation to evaluate for Crohn's disease given his demographics  I have discussed alternative options, risks & benefits,  which include, but are not limited to, bleeding, infection, perforation,respiratory complication & drug reaction.  The patient agrees with this plan & written consent will be obtained.    Follow up after the colonoscopy   Cephas Darby, MD

## 2017-02-16 NOTE — Patient Instructions (Signed)
1. Please contact our office after Christmas to schedule your colonoscopy with Dr. Allegra LaiVanga.  [phone] for January.  2. Sign up for MyChart for easy convenient communication with your care team at [facility].  Have a great holiday season.

## 2017-02-22 ENCOUNTER — Ambulatory Visit: Payer: Self-pay | Admitting: Psychiatry

## 2017-03-18 ENCOUNTER — Emergency Department
Admission: EM | Admit: 2017-03-18 | Discharge: 2017-03-19 | Disposition: A | Discharge status: Other Acute Inpatient Hospital | Payer: Medicaid Other | Attending: Emergency Medicine | Admitting: Emergency Medicine

## 2017-03-18 DIAGNOSIS — Z79899 Other long term (current) drug therapy: Secondary | ICD-10-CM | POA: Insufficient documentation

## 2017-03-18 DIAGNOSIS — I129 Hypertensive chronic kidney disease with stage 1 through stage 4 chronic kidney disease, or unspecified chronic kidney disease: Secondary | ICD-10-CM | POA: Insufficient documentation

## 2017-03-18 DIAGNOSIS — E1165 Type 2 diabetes mellitus with hyperglycemia: Secondary | ICD-10-CM | POA: Insufficient documentation

## 2017-03-18 DIAGNOSIS — Z87891 Personal history of nicotine dependence: Secondary | ICD-10-CM | POA: Insufficient documentation

## 2017-03-18 DIAGNOSIS — N183 Chronic kidney disease, stage 3 (moderate): Secondary | ICD-10-CM | POA: Insufficient documentation

## 2017-03-18 DIAGNOSIS — R45851 Suicidal ideations: Secondary | ICD-10-CM | POA: Insufficient documentation

## 2017-03-18 DIAGNOSIS — R739 Hyperglycemia, unspecified: Secondary | ICD-10-CM

## 2017-03-18 LAB — COMPREHENSIVE METABOLIC PANEL
ALBUMIN: 4.6 g/dL (ref 3.5–5.0)
ALK PHOS: 120 U/L (ref 38–126)
ALT: 31 U/L (ref 17–63)
AST: 28 U/L (ref 15–41)
Anion gap: 11 (ref 5–15)
BILIRUBIN TOTAL: 0.2 mg/dL — AB (ref 0.3–1.2)
BUN: 14 mg/dL (ref 6–20)
CALCIUM: 9.5 mg/dL (ref 8.9–10.3)
CO2: 22 mmol/L (ref 22–32)
CREATININE: 1.59 mg/dL — AB (ref 0.61–1.24)
Chloride: 100 mmol/L — ABNORMAL LOW (ref 101–111)
GFR calc Af Amer: >60 mL/min (ref >60)
GFR, EST NON AFRICAN AMERICAN: 57 mL/min — AB (ref >60)
GLUCOSE: 407 mg/dL — AB (ref 65–99)
POTASSIUM: 4.9 mmol/L (ref 3.5–5.1)
Sodium: 133 mmol/L — ABNORMAL LOW (ref 135–145)
TOTAL PROTEIN: 8.5 g/dL — AB (ref 6.5–8.1)

## 2017-03-18 LAB — URINE DRUG SCREEN, QUALITATIVE (ARMC ONLY)
AMPHETAMINES, UR SCREEN: NOT DETECTED
BENZODIAZEPINE, UR SCRN: NOT DETECTED
Barbiturates, Ur Screen: NOT DETECTED
CANNABINOID 50 NG, UR ~~LOC~~: POSITIVE — AB
Cocaine Metabolite,Ur ~~LOC~~: NOT DETECTED
MDMA (Ecstasy)Ur Screen: NOT DETECTED
Methadone Scn, Ur: NOT DETECTED
Opiate, Ur Screen: NOT DETECTED
PHENCYCLIDINE (PCP) UR S: NOT DETECTED
Tricyclic, Ur Screen: NOT DETECTED

## 2017-03-18 LAB — CBC
HEMATOCRIT: 42.9 % (ref 40.0–52.0)
Hemoglobin: 14.3 g/dL (ref 13.0–18.0)
MCH: 29.3 pg (ref 26.0–34.0)
MCHC: 33.3 g/dL (ref 32.0–36.0)
MCV: 88.1 fL (ref 80.0–100.0)
Platelets: 434 10*3/uL (ref 150–440)
RBC: 4.87 MIL/uL (ref 4.40–5.90)
RDW: 12.5 % (ref 11.5–14.5)
WBC: 11.2 10*3/uL — AB (ref 3.8–10.6)

## 2017-03-18 LAB — SALICYLATE LEVEL: Salicylate Lvl: <7 mg/dL (ref 2.8–30.0)

## 2017-03-18 LAB — GLUCOSE, CAPILLARY: Glucose-Capillary: 371 mg/dL — ABNORMAL HIGH (ref 65–99)

## 2017-03-18 LAB — ETHANOL: Alcohol, Ethyl (B): <10 mg/dL (ref <10)

## 2017-03-18 LAB — ACETAMINOPHEN LEVEL: Acetaminophen (Tylenol), Serum: <10 ug/mL — ABNORMAL LOW (ref 10–30)

## 2017-03-18 MED ORDER — DIVALPROEX SODIUM ER 500 MG PO TB24
750.0000 mg | ORAL_TABLET | Freq: Every day | ORAL | Status: DC
  Administered 2017-03-19: 750 mg via ORAL
  Filled 2017-03-18 (×2): qty 1

## 2017-03-18 MED ORDER — SODIUM CHLORIDE 0.9 % IV SOLN
Freq: Once | INTRAVENOUS | Status: AC
  Administered 2017-03-18: 21:00:00 via INTRAVENOUS

## 2017-03-18 MED ORDER — METFORMIN HCL 500 MG PO TABS
500.0000 mg | ORAL_TABLET | Freq: Every day | ORAL | Status: DC
  Administered 2017-03-19: 500 mg via ORAL
  Filled 2017-03-18: qty 1

## 2017-03-18 MED ORDER — AMLODIPINE BESYLATE 5 MG PO TABS
10.0000 mg | ORAL_TABLET | Freq: Every day | ORAL | Status: DC
  Administered 2017-03-19: 10 mg via ORAL
  Filled 2017-03-18: qty 2

## 2017-03-18 MED ORDER — METFORMIN HCL 500 MG PO TABS
500.0000 mg | ORAL_TABLET | Freq: Once | ORAL | Status: AC
  Administered 2017-03-18: 500 mg via ORAL
  Filled 2017-03-18: qty 1

## 2017-03-18 MED ORDER — BUPROPION HCL 75 MG PO TABS
75.0000 mg | ORAL_TABLET | Freq: Every day | ORAL | Status: DC
  Administered 2017-03-19: 75 mg via ORAL
  Filled 2017-03-18: qty 1

## 2017-03-18 MED ORDER — LISINOPRIL 20 MG PO TABS
20.0000 mg | ORAL_TABLET | Freq: Every day | ORAL | Status: DC
  Administered 2017-03-19: 20 mg via ORAL
  Filled 2017-03-18: qty 1

## 2017-03-18 MED ORDER — HYDROXYZINE PAMOATE 25 MG PO CAPS
25.0000 mg | ORAL_CAPSULE | Freq: Three times a day (TID) | ORAL | Status: DC | PRN
  Filled 2017-03-18: qty 1

## 2017-03-18 NOTE — ED Notes (Signed)
BEHAVIORAL HEALTH ROUNDING  Patient sleeping: No.  Patient alert and oriented: yes  Behavior appropriate: Yes. ; If no, describe:  Nutrition and fluids offered: Yes  Toileting and hygiene offered: Yes  Sitter present: not applicable, Q 15 min safety rounds and observation.  Law enforcement present: Yes ODS  

## 2017-03-18 NOTE — ED Notes (Signed)

## 2017-03-18 NOTE — ED Notes (Signed)
[facility]
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
## 2017-03-18 NOTE — ED Notes (Signed)
Patient is IVC and is pending SOC consult. 

## 2017-03-18 NOTE — ED Notes (Signed)
Requested Dr Cyril LoosenKinner to reorder home meds.
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
## 2017-03-19 ENCOUNTER — Other Ambulatory Visit: Payer: Self-pay

## 2017-03-19 ENCOUNTER — Encounter: Payer: Self-pay | Admitting: *Deleted

## 2017-03-19 ENCOUNTER — Inpatient Hospital Stay
Admission: AD | Admit: 2017-03-19 | Discharge: 2017-03-23 | DRG: 885 | Disposition: A | Discharge status: Home / Self Care | Payer: No Typology Code available for payment source | Attending: Psychiatry | Admitting: Psychiatry

## 2017-03-19 DIAGNOSIS — E785 Hyperlipidemia, unspecified: Secondary | ICD-10-CM | POA: Diagnosis present

## 2017-03-19 DIAGNOSIS — E669 Obesity, unspecified: Secondary | ICD-10-CM | POA: Diagnosis present

## 2017-03-19 DIAGNOSIS — Z7982 Long term (current) use of aspirin: Secondary | ICD-10-CM

## 2017-03-19 DIAGNOSIS — Z62810 Personal history of physical and sexual abuse in childhood: Secondary | ICD-10-CM | POA: Diagnosis present

## 2017-03-19 DIAGNOSIS — N183 Chronic kidney disease, stage 3 unspecified: Secondary | ICD-10-CM | POA: Diagnosis present

## 2017-03-19 DIAGNOSIS — R45851 Suicidal ideations: Secondary | ICD-10-CM | POA: Diagnosis present

## 2017-03-19 DIAGNOSIS — Z79899 Other long term (current) drug therapy: Secondary | ICD-10-CM

## 2017-03-19 DIAGNOSIS — I10 Essential (primary) hypertension: Secondary | ICD-10-CM

## 2017-03-19 DIAGNOSIS — Z87891 Personal history of nicotine dependence: Secondary | ICD-10-CM

## 2017-03-19 DIAGNOSIS — F3181 Bipolar II disorder: Secondary | ICD-10-CM | POA: Diagnosis present

## 2017-03-19 DIAGNOSIS — E118 Type 2 diabetes mellitus with unspecified complications: Secondary | ICD-10-CM | POA: Diagnosis present

## 2017-03-19 DIAGNOSIS — T43296A Underdosing of other antidepressants, initial encounter: Secondary | ICD-10-CM | POA: Diagnosis present

## 2017-03-19 DIAGNOSIS — F419 Anxiety disorder, unspecified: Secondary | ICD-10-CM | POA: Diagnosis present

## 2017-03-19 DIAGNOSIS — Z91128 Patient's intentional underdosing of medication regimen for other reason: Secondary | ICD-10-CM

## 2017-03-19 DIAGNOSIS — F1220 Cannabis dependence, uncomplicated: Secondary | ICD-10-CM | POA: Diagnosis present

## 2017-03-19 DIAGNOSIS — F4312 Post-traumatic stress disorder, chronic: Secondary | ICD-10-CM | POA: Diagnosis present

## 2017-03-19 DIAGNOSIS — I129 Hypertensive chronic kidney disease with stage 1 through stage 4 chronic kidney disease, or unspecified chronic kidney disease: Secondary | ICD-10-CM | POA: Diagnosis present

## 2017-03-19 DIAGNOSIS — E1122 Type 2 diabetes mellitus with diabetic chronic kidney disease: Secondary | ICD-10-CM | POA: Diagnosis present

## 2017-03-19 DIAGNOSIS — Z630 Problems in relationship with spouse or partner: Secondary | ICD-10-CM | POA: Diagnosis not present

## 2017-03-19 DIAGNOSIS — Z599 Problem related to housing and economic circumstances, unspecified: Secondary | ICD-10-CM

## 2017-03-19 DIAGNOSIS — F1210 Cannabis abuse, uncomplicated: Secondary | ICD-10-CM

## 2017-03-19 DIAGNOSIS — Z915 Personal history of self-harm: Secondary | ICD-10-CM | POA: Diagnosis not present

## 2017-03-19 DIAGNOSIS — F332 Major depressive disorder, recurrent severe without psychotic features: Secondary | ICD-10-CM

## 2017-03-19 LAB — GLUCOSE, CAPILLARY: GLUCOSE-CAPILLARY: 293 mg/dL — AB (ref 65–99)

## 2017-03-19 MED ORDER — ALUM & MAG HYDROXIDE-SIMETH 200-200-20 MG/5ML PO SUSP: 30.0000 mL | ORAL | Status: DC | PRN

## 2017-03-19 MED ORDER — HYDROXYZINE PAMOATE 25 MG PO CAPS
25.0000 mg | ORAL_CAPSULE | Freq: Three times a day (TID) | ORAL | Status: DC | PRN
  Filled 2017-03-19: qty 1

## 2017-03-19 MED ORDER — METFORMIN HCL 500 MG PO TABS
1000.0000 mg | ORAL_TABLET | Freq: Every day | ORAL | Status: DC
Start: 2017-03-20 — End: 2017-03-23
  Administered 2017-03-20 – 2017-03-23 (×4): 1000 mg via ORAL
  Filled 2017-03-19 (×4): qty 2

## 2017-03-19 MED ORDER — MAGNESIUM HYDROXIDE 400 MG/5ML PO SUSP: 30.0000 mL | Freq: Every day | ORAL | Status: DC | PRN

## 2017-03-19 MED ORDER — AMLODIPINE BESYLATE 5 MG PO TABS
10.0000 mg | ORAL_TABLET | Freq: Every day | ORAL | Status: DC
  Administered 2017-03-20 – 2017-03-23 (×4): 10 mg via ORAL
  Filled 2017-03-19 (×4): qty 2

## 2017-03-19 MED ORDER — TRAZODONE HCL 100 MG PO TABS
100.0000 mg | ORAL_TABLET | Freq: Every day | ORAL | Status: DC
  Administered 2017-03-19 – 2017-03-22 (×4): 100 mg via ORAL
  Filled 2017-03-19 (×5): qty 1

## 2017-03-19 MED ORDER — ACETAMINOPHEN 325 MG PO TABS
650.0000 mg | ORAL_TABLET | Freq: Four times a day (QID) | ORAL | Status: DC | PRN
  Administered 2017-03-23: 650 mg via ORAL
  Filled 2017-03-19: qty 2

## 2017-03-19 MED ORDER — METFORMIN HCL 500 MG PO TABS: 500.0000 mg | ORAL_TABLET | Freq: Every day | ORAL | Status: DC

## 2017-03-19 MED ORDER — DIVALPROEX SODIUM 500 MG PO DR TAB
DELAYED_RELEASE_TABLET | ORAL | Status: AC
  Administered 2017-03-19: 500 mg
  Filled 2017-03-19: qty 1

## 2017-03-19 MED ORDER — HYDROXYZINE HCL 25 MG PO TABS
ORAL_TABLET | ORAL | Status: AC
  Administered 2017-03-19: 25 mg
  Filled 2017-03-19: qty 1

## 2017-03-19 MED ORDER — INSULIN ASPART 100 UNIT/ML ~~LOC~~ SOLN
0.0000 [IU] | Freq: Three times a day (TID) | SUBCUTANEOUS | Status: DC
  Administered 2017-03-19: 5 [IU] via SUBCUTANEOUS
  Filled 2017-03-19: qty 1

## 2017-03-19 MED ORDER — INSULIN ASPART 100 UNIT/ML ~~LOC~~ SOLN
0.0000 [IU] | Freq: Three times a day (TID) | SUBCUTANEOUS | Status: DC
  Administered 2017-03-19 – 2017-03-20 (×2): 7 [IU] via SUBCUTANEOUS
  Administered 2017-03-20: 3 [IU] via SUBCUTANEOUS
  Administered 2017-03-20: 5 [IU] via SUBCUTANEOUS
  Administered 2017-03-21: 3 [IU] via SUBCUTANEOUS
  Filled 2017-03-19: qty 1

## 2017-03-19 MED ORDER — DIVALPROEX SODIUM ER 500 MG PO TB24
750.0000 mg | ORAL_TABLET | Freq: Every day | ORAL | Status: DC
  Filled 2017-03-19: qty 1

## 2017-03-19 MED ORDER — DIVALPROEX SODIUM 250 MG PO DR TAB
DELAYED_RELEASE_TABLET | ORAL | Status: AC
  Administered 2017-03-19: 250 mg
  Filled 2017-03-19: qty 1

## 2017-03-19 MED ORDER — SERTRALINE HCL 25 MG PO TABS
50.0000 mg | ORAL_TABLET | Freq: Every day | ORAL | Status: DC
  Administered 2017-03-19 – 2017-03-20 (×2): 50 mg via ORAL
  Filled 2017-03-19 (×2): qty 2

## 2017-03-19 MED ORDER — DIVALPROEX SODIUM ER 500 MG PO TB24
1000.0000 mg | ORAL_TABLET | Freq: Every day | ORAL | Status: DC
  Administered 2017-03-19: 1000 mg via ORAL
  Filled 2017-03-19: qty 2

## 2017-03-19 MED ORDER — LISINOPRIL 20 MG PO TABS
20.0000 mg | ORAL_TABLET | Freq: Every day | ORAL | Status: DC
  Administered 2017-03-20 – 2017-03-23 (×4): 20 mg via ORAL
  Filled 2017-03-19 (×4): qty 1
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
## 2017-03-19 NOTE — ED Notes (Signed)
Patient with transfer orders, will be admitted to room 315, nurse did give report to Nurse that will be resuming care, patient voices understanding of transfer, all belongings taken with Patient and Patient transferred with police escort.

## 2017-03-19 NOTE — [facility] Counselor (Signed)
Patient has been accepted to [facility] by Dr. Maryruth BunKapur to Dr. Jennet MaduroPucilowska Bed 315; information provided to registration

## 2017-03-19 NOTE — Progress Notes (Signed)
Patient ID: Carlos MusicJoshua David [name], male   DOB: [birth year], 32 y.o.   MRN: [id number]        Pt blood sugar was 350 on 03/19/17 @430pm . Jacquelyne BalintShalita Emmalyne Giacomo RN

## 2017-03-19 NOTE — ED Notes (Signed)

## 2017-03-19 NOTE — Progress Notes (Signed)
Patient ID: Carlos [name], male   DOB: 01-Sep-1985, 32 y.o.   MRN: [id number]   32 year old pt admitted after [facility] with depression and a recent suicide attempt. Pt reported at time of admission that he was really depressed and that he just wanted to get help. Pt reported that he was having issues with his wife due to his behavior and that he came to the hospital mainly for his wife. Pt reported that prior to admission he attempted to walk in front of a truck. Pt reported that he is glad that he is ok, and now he has a chance to fix his marriage. Pt reported that he was in inpt treatment about 6 years ago here at the [facility]. Pt reported that he just wants help. No other issues or concerns noted at time of admission.

## 2017-03-19 NOTE — ED Notes (Signed)
Patient is talking on the phone calmly, no signs of distress.

## 2017-03-19 NOTE — ED Provider Notes (Signed)
-----------------------------------------   6:58 AM on 03/19/2017 -----------------------------------------   Blood pressure (!) 142/95, pulse 75, temperature 97.6 F (36.4 C), resp. rate 18, height 5\' 6"  (1.676 m), weight 127 kg (280 lb), SpO2 95 %.  The patient had no acute events since last update.  Calm and cooperative at this time.  Disposition is pending Psychiatry/Behavioral Medicine team recommendations.     Irean HongSung, Nela Bascom J, MD 03/19/17 [phone]
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
## 2017-03-19 NOTE — ED Notes (Signed)
Blood sugar  293

## 2017-03-19 NOTE — [facility] Group Notes (Signed)
[facility] Group Notes:  (Nursing/MHT/Case Management/Adjunct)  Date:  03/19/2017  Time:  11:22 PM  Type of Therapy:  Group Therapy  Participation Level:  Active  Participation Quality:  Appropriate  Affect:  Appropriate  Cognitive:  Appropriate  Insight:  Appropriate  Engagement in Group:  Engaged  Modes of Intervention:  Discussion  Summary of Progress/Problems:  Carlos [name] 03/19/2017, 11:22 PM

## 2017-03-19 NOTE — ED Notes (Signed)
Patient talking with Dr. Ermalinda MemosBradshaw  S.O.C.

## 2017-03-19 NOTE — Tx Team (Signed)
Initial Treatment Plan 03/19/2017 3:19 PM Carlos [name] [id number]    PATIENT STRESSORS: Financial difficulties Marital or family conflict   PATIENT STRENGTHS: Ability for insight General fund of knowledge   PATIENT IDENTIFIED PROBLEMS: Depression     SI                 DISCHARGE CRITERIA:  Improved stabilization in mood, thinking, and/or behavior  PRELIMINARY DISCHARGE PLAN: Return to previous living arrangement  PATIENT/FAMILY INVOLVEMENT: This treatment plan has been presented to and reviewed with the patient, Carlos MusicJoshua David Laske, and/or family member.  The patient and family have been given the opportunity to ask questions and make suggestions.  Buford DresserForrest, Beva Remund Shanta, RN 03/19/2017, 3:19 PM

## 2017-03-19 NOTE — [facility] Group Notes (Signed)
LCSW Group Therapy Note 03/19/2017 1:15pm  Type of Therapy and Topic: Group Therapy: Feelings Around Returning Home & Establishing a Supportive Framework and Supporting Oneself When Supports Not Available  Participation Level: Did Not Attend  Description of Group:  Patients first processed thoughts and feelings about upcoming discharge. These included fears of upcoming changes, lack of change, new living environments, judgements and expectations from others and overall stigma of mental health issues. The group then discussed the definition of a supportive framework, what that looks and feels like, and how do to discern it from an unhealthy non-supportive network. The group identified different types of supports as well as what to do when your family/friends are less than helpful or unavailable  Therapeutic Goals  1. Patient will identify one healthy supportive network that they can use at discharge. 2. Patient will identify one factor of a supportive framework and how to tell it from an unhealthy network. 3. Patient able to identify one coping skill to use when they do not have positive supports from others. 4. Patient will demonstrate ability to communicate their needs through discussion and/or role plays.  Summary of Patient Progress:    Therapeutic Modalities Cognitive Behavioral Therapy Motivational Interviewing   Ladd Cen  CUEBAS-COLON, LCSW 03/19/2017 4:28 PM

## 2017-03-19 NOTE — ED Notes (Signed)
Patient did take all morning medications, patient is pleasant, no signs of distress , denies si/hi or avh. q 15 minute checks and camera surveillance in progress for safety.
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
## 2017-03-20 LAB — HEMOGLOBIN A1C
Hgb A1c MFr Bld: 12.7 % — ABNORMAL HIGH (ref 4.8–5.6)
MEAN PLASMA GLUCOSE: 317.79 mg/dL

## 2017-03-20 LAB — TSH: TSH: 1.95 u[IU]/mL (ref 0.350–4.500)

## 2017-03-20 LAB — GLUCOSE, CAPILLARY
GLUCOSE-CAPILLARY: 288 mg/dL — AB (ref 65–99)
GLUCOSE-CAPILLARY: 299 mg/dL — AB (ref 65–99)
GLUCOSE-CAPILLARY: 350 mg/dL — AB (ref 65–99)
Glucose-Capillary: 250 mg/dL — ABNORMAL HIGH (ref 65–99)
Glucose-Capillary: 308 mg/dL — ABNORMAL HIGH (ref 65–99)
Glucose-Capillary: 206 mg/dL — ABNORMAL HIGH (ref 65–99)
Glucose-Capillary: 240 mg/dL — ABNORMAL HIGH (ref 65–99)

## 2017-03-20 LAB — LIPID PANEL
CHOL/HDL RATIO: 6.2 ratio
Cholesterol: 206 mg/dL — ABNORMAL HIGH (ref 0–200)
HDL: 33 mg/dL — AB (ref >40)
LDL CALC: UNDETERMINED mg/dL (ref 0–99)
TRIGLYCERIDES: 479 mg/dL — AB (ref <150)
VLDL: UNDETERMINED mg/dL (ref 0–40)

## 2017-03-20 LAB — VITAMIN B12: Vitamin B-12: 426 pg/mL (ref 180–914)

## 2017-03-20 MED ORDER — LIVING WELL WITH DIABETES BOOK
Freq: Once | Status: AC
  Administered 2017-03-20: 14:00:00
  Filled 2017-03-20: qty 1

## 2017-03-20 MED ORDER — BUPROPION HCL ER (XL) 150 MG PO TB24
150.0000 mg | ORAL_TABLET | Freq: Every day | ORAL | Status: DC
  Administered 2017-03-20 – 2017-03-23 (×4): 150 mg via ORAL
  Filled 2017-03-20 (×4): qty 1

## 2017-03-20 MED ORDER — DIVALPROEX SODIUM 500 MG PO DR TAB
500.0000 mg | DELAYED_RELEASE_TABLET | Freq: Three times a day (TID) | ORAL | Status: DC
  Administered 2017-03-20 – 2017-03-23 (×9): 500 mg via ORAL
  Filled 2017-03-20 (×10): qty 1

## 2017-03-20 NOTE — Progress Notes (Signed)
CH present for treatment team meeting with patient. Patient stated he no longer had suicidal thoughts and has a supportive group to assist him once discharged. He voiced concern about a friend that had her ankle hurt while on the porch with the dog. He stated she was in the hospital here he believed. Patient stated that he realized that he needed to seek help and will follow-up with appointments upon discharge.    03/20/17 1000  Clinical Encounter Type  Visited With Patient;Health care provider  Visit Type Initial;Spiritual support;Behavioral Health  Referral From Chaplain;Physician

## 2017-03-20 NOTE — Tx Team (Signed)
Interdisciplinary Treatment and Diagnostic Plan Update  03/20/2017 Time of Session: 10:30am Carlos [name] MRN: [id number]  Principal Diagnosis: Major depressive disorder, recurrent severe without psychotic features (HCC)  Secondary Diagnoses: Principal Problem:   Major depressive disorder, recurrent severe without psychotic features (HCC) Active Problems:   Essential hypertension   Obesity   Type 2 diabetes mellitus with complication (HCC)   Cannabis use disorder, moderate, dependence (HCC)   Chronic posttraumatic stress disorder   Current Medications:  Current Facility-Administered Medications  Medication Dose Route Frequency Provider Last Rate Last Dose  . acetaminophen (TYLENOL) tablet 650 mg  650 mg Oral Q6H PRN Darliss Ridgel, MD      . alum & mag hydroxide-simeth (MAALOX/MYLANTA) 200-200-20 MG/5ML suspension 30 mL  30 mL Oral Q4H PRN Darliss Ridgel, MD      . amLODipine (NORVASC) tablet 10 mg  10 mg Oral Daily Darliss Ridgel, MD   10 mg at 03/20/17 0853  . buPROPion (WELLBUTRIN XL) 24 hr tablet 150 mg  150 mg Oral Daily Pucilowska, Jolanta B, MD   150 mg at 03/20/17 1403  . divalproex (DEPAKOTE) DR tablet 500 mg  500 mg Oral Q8H Pucilowska, Jolanta B, MD   500 mg at 03/20/17 1403  . hydrOXYzine (VISTARIL) capsule 25 mg  25 mg Oral TID PRN Darliss Ridgel, MD      . insulin aspart (novoLOG) injection 0-9 Units  0-9 Units Subcutaneous TID WC Darliss Ridgel, MD   5 Units at 03/20/17 1200  . lisinopril (PRINIVIL,ZESTRIL) tablet 20 mg  20 mg Oral Daily Darliss Ridgel, MD   20 mg at 03/20/17 0852  . magnesium hydroxide (MILK OF MAGNESIA) suspension 30 mL  30 mL Oral Daily PRN Darliss Ridgel, MD      . metFORMIN (GLUCOPHAGE) tablet 1,000 mg  1,000 mg Oral Q breakfast Darliss Ridgel, MD   1,000 mg at 03/20/17 0853  . traZODone (DESYREL) tablet 100 mg  100 mg Oral QHS Pucilowska, Jolanta B, MD   100 mg at 03/19/17 2201   PTA Medications: Medications Prior to Admission  Medication  Sig Dispense Refill Last Dose  . buPROPion (WELLBUTRIN) 75 MG tablet Take 1 tablet (75 mg total) daily by mouth. 30 tablet 1 Past Week at Unknown time  . dicyclomine (BENTYL) 20 MG tablet Take 1 tablet (20 mg total) by mouth 3 (three) times daily as needed for spasms. 30 tablet 0 Past Week at Unknown time  . divalproex (DEPAKOTE ER) 250 MG 24 hr tablet Take 3 tablets (750 mg total) at bedtime by mouth. 90 tablet 1 Past Week at Unknown time  . hydrOXYzine (VISTARIL) 25 MG capsule Take 1 capsule (25 mg total) 3 (three) times daily as needed by mouth for anxiety (ALSO FOR INSOMNIA). 90 capsule 1 Past Month at Unknown time  . lisinopril (PRINIVIL,ZESTRIL) 20 MG tablet Take 1 tablet (20 mg total) by mouth daily. 90 tablet 3 Past Week at Unknown time  . amLODipine (NORVASC) 10 MG tablet Take 1 tablet (10 mg total) by mouth daily. 90 tablet 3 Taking    Patient Stressors: Financial difficulties Marital or family conflict  Patient Strengths: Ability for insight General fund of knowledge  Treatment Modalities: Medication Management, Group therapy, Case management,  1 to 1 session with clinician, Psychoeducation, Recreational therapy.   Physician Treatment Plan for Primary Diagnosis: Major depressive disorder, recurrent severe without psychotic features (HCC) Long Term Goal(s): Improvement in symptoms so as ready for discharge  Improvement in symptoms so as ready for discharge   Short Term Goals: Ability to identify changes in lifestyle to reduce recurrence of condition will improve Ability to verbalize feelings will improve Ability to disclose and discuss suicidal ideas Ability to identify and develop effective coping behaviors will improve Compliance with prescribed medications will improve Ability to identify changes in lifestyle to reduce recurrence of condition will improve Ability to verbalize feelings will improve Ability to disclose and discuss suicidal ideas Ability to demonstrate  self-control will improve Ability to identify and develop effective coping behaviors will improve Compliance with prescribed medications will improve  Medication Management: Evaluate patient's response, side effects, and tolerance of medication regimen.  Therapeutic Interventions: 1 to 1 sessions, Unit Group sessions and Medication administration.  Evaluation of Outcomes: Progressing  Physician Treatment Plan for Secondary Diagnosis: Principal Problem:   Major depressive disorder, recurrent severe without psychotic features (HCC) Active Problems:   Essential hypertension   Obesity   Type 2 diabetes mellitus with complication (HCC)   Cannabis use disorder, moderate, dependence (HCC)   Chronic posttraumatic stress disorder  Long Term Goal(s): Improvement in symptoms so as ready for discharge Improvement in symptoms so as ready for discharge   Short Term Goals: Ability to identify changes in lifestyle to reduce recurrence of condition will improve Ability to verbalize feelings will improve Ability to disclose and discuss suicidal ideas Ability to identify and develop effective coping behaviors will improve Compliance with prescribed medications will improve Ability to identify changes in lifestyle to reduce recurrence of condition will improve Ability to verbalize feelings will improve Ability to disclose and discuss suicidal ideas Ability to demonstrate self-control will improve Ability to identify and develop effective coping behaviors will improve Compliance with prescribed medications will improve     Medication Management: Evaluate patient's response, side effects, and tolerance of medication regimen.  Therapeutic Interventions: 1 to 1 sessions, Unit Group sessions and Medication administration.  Evaluation of Outcomes: Progressing   RN Treatment Plan for Primary Diagnosis: Major depressive disorder, recurrent severe without psychotic features (HCC) Long Term Goal(s):  Knowledge of disease and therapeutic regimen to maintain health will improve  Short Term Goals: Ability to identify and develop effective coping behaviors will improve and Compliance with prescribed medications will improve  Medication Management: RN will administer medications as ordered by provider, will assess and evaluate patient's response and provide education to patient for prescribed medication. RN will report any adverse and/or side effects to prescribing provider.  Therapeutic Interventions: 1 on 1 counseling sessions, Psychoeducation, Medication administration, Evaluate responses to treatment, Monitor vital signs and CBGs as ordered, Perform/monitor CIWA, COWS, AIMS and Fall Risk screenings as ordered, Perform wound care treatments as ordered.  Evaluation of Outcomes: Progressing   LCSW Treatment Plan for Primary Diagnosis: Major depressive disorder, recurrent severe without psychotic features (HCC) Long Term Goal(s): Safe transition to appropriate next level of care at discharge, Engage patient in therapeutic group addressing interpersonal concerns.  Short Term Goals: Engage patient in aftercare planning with referrals and resources, Identify triggers associated with mental health/substance abuse issues and Increase skills for wellness and recovery  Therapeutic Interventions: Assess for all discharge needs, 1 to 1 time with Social worker, Explore available resources and support systems, Assess for adequacy in community support network, Educate family and significant other(s) on suicide prevention, Complete Psychosocial Assessment, Interpersonal group therapy.  Evaluation of Outcomes: Progressing   Progress in Treatment: Attending groups: Yes. Participating in groups: Yes. Taking medication as prescribed: Yes. Toleration medication:  Yes. Family/Significant other contact made: No, will contact:    Patient understands diagnosis: Yes. Discussing patient identified problems/goals  with staff: Yes. Medical problems stabilized or resolved: Yes. Denies suicidal/homicidal ideation: Yes. Issues/concerns per patient self-inventory: No. Other:    New problem(s) identified: No, Describe:     New Short Term/Long Term Goal(s):  Discharge Plan or Barriers:   Reason for Continuation of Hospitalization: Anxiety Depression Medication stabilization  Coordination of After-care plan  Estimated Length of Stay: 1-2 days  Recreational Therapy: Patient Stressors: SI and attempt Patient Goal: Patient will identify 3 triggers for depressive symptoms x5 days.   Attendees: Patient:Carlos [name] 03/20/2017 3:26 PM  Physician: Kristine LineaJolanta Pucilowska, MD 03/20/2017 3:26 PM  Nursing: Leonia ReaderPhyllis Cobb, RN 03/20/2017 3:26 PM  RN Care Manager: 03/20/2017 3:26 PM  Social Worker: Jake SharkSara Laws, LCSW 03/20/2017 3:26 PM  Recreational Therapist: Garret ReddishShay Airel Magadan, LRT 03/20/2017 3:26 PM  Other:  03/20/2017 3:26 PM  Other:  03/20/2017 3:26 PM  Other: 03/20/2017 3:26 PM    Scribe for Treatment Team: Glennon MacSara P Laws, LCSW 03/20/2017 3:26 PM
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
## 2017-03-20 NOTE — BHH Group Notes (Addendum)
03/20/2017 9:30AM  Type of Therapy and Topic:  Group Therapy:  Overcoming Obstacles  Participation Level:  Active    Description of Group:    In this group patients will be encouraged to explore what they see as obstacles to their own wellness and recovery. They will be guided to discuss their thoughts, feelings, and behaviors related to these obstacles. The group will process together ways to cope with barriers, with attention given to specific choices patients can make. Each patient will be challenged to identify changes they are motivated to make in order to overcome their obstacles. This group will be process-oriented, with patients participating in exploration of their own experiences as well as giving and receiving support and challenge from other group members.   Therapeutic Goals: 1. Patient will identify personal and current obstacles as they relate to admission. 2. Patient will identify barriers that currently interfere with their wellness or overcoming obstacles.  3. Patient will identify feelings, thought process and behaviors related to these barriers. 4. Patient will identify two changes they are willing to make to overcome these obstacles:      Summary of Patient Progress Actively and appropriately engaged in the group. Patient was able to provide support and validation to other group members.Patient practiced active listening when interacting with the facilitator and other group members Patient in still in the process of obtaining treatment goals. Ivin BootyJoshua says that his obstacle is his anger and exploding on others. "I know that when I leave here I need to communicate better with my wife and my friends and work on my anger."    Therapeutic Modalities:   Cognitive Behavioral Therapy Solution Focused Therapy Motivational Interviewing Relapse Prevention Therapy    Johny ShearsCassandra Tenlee Wollin MSW, [facility]WA 03/20/2017 10:24 AM

## 2017-03-20 NOTE — Progress Notes (Signed)
Patient is pleasant and cooperative in the unit.Denies SI,HI and AVH.Appropriate with staff & peers.Compliant with medications.Attended groups.Appetite and energy level good.Support and encouragement given. 

## 2017-03-20 NOTE — Progress Notes (Addendum)
Inpatient Diabetes Program Recommendations  AACE/ADA: New Consensus Statement on Inpatient Glycemic Control (2015)  Target Ranges:  Prepandial:   less than 140 mg/dL      Peak postprandial:   less than 180 mg/dL (1-2 hours)      Critically ill patients:  140 - 180 mg/dL   Results for Bralley, Drexler DAVID "[name]" (MRN [id number]) as of 03/20/2017 09:29  Ref. Range 06/06/2016 04:25 03/20/2017 06:49  Hemoglobin A1C Latest Ref Range: 4.8 - 5.6 % 8.2 (H) 12.7 (H)   Results for Macqueen, Jeziel DAVID "[name]" (MRN [id number]) as of 03/20/2017 09:29  Ref. Range 03/19/2017 10:51 03/19/2017 16:17 03/20/2017 00:32 03/20/2017 07:08  Glucose-Capillary Latest Ref Range: 65 - 99 mg/dL 696293 (H) 295350 (H) 284288 (H) 250 (H)    Admit with: Major Depression  History: DM  Home DM Meds: None  Current Insulin Orders: Novolog Sensitive Correction Scale/ SSI (0-9 units) TID AC       Metformin 1000 mg daily       MD- Note patient was not taking any DM medications prior to admission.  Current Hemoglobin A1c level much worse from last year (increased to 12.7% currently- was 8.2% back in March 2018).  Not sure if patient has a PCP to help him manage his diabetes as an outpatient?  Definitely needs to get established with PCP as an outpatient.  Note Metformin started in the PM on 01/05.  Dose increased to 1000 mg daily today.  Will take several days to weeks to reach therapeutic levels.  Please consider the following in-hospital insulin adjustments:  1. Start Lantus 12 units daily (0.1 units/kg dosing)  2. Increase Novolog SSI to Moderate scale (0-15 units) TID AC + HS  3. Not sure if patient will need insulin at time of discharge??  His current A1c of 12.7% suggests that he may need insulin in addition to Metformin for home.  If you decide to send pt home on insulin, would recommend inexpensive insulin (I'm not sure patient has thorough insurance coverage).  Reli-On brand 70/30 insulin at [facility] is $25 per vial and Insulin  Syringes are also fairly inexpensive at [facility] as well.  I recommend we start patient on Lantus in-hospital to see how much basal insulin he may require and then can convert to 70/30 Insulin at time of d/c.  70/30 insulin will be much less expensive and easier to take as it can be given in BID doses.      --Will follow patient during hospitalization--  Ambrose FinlandJeannine Johnston Niomie Englert RN, MSN, CDE Diabetes Coordinator Inpatient Glycemic Control Team Team Pager: [phone] (8a-5p)

## 2017-03-20 NOTE — Progress Notes (Signed)
Recreation Therapy Notes  INPATIENT RECREATION THERAPY ASSESSMENT  Patient Details Name: Joaquin MusicJoshua David Lavery MRN: [id number] DOB: 10/29/1985 Today's Date: 03/20/2017  Patient Stressors: Other (Comment)(SI and attempt)  Coping Skills:   Substance Abuse, Music  Personal Challenges: Anger, Communication, Concentration, Decision-Making, Expressing Yourself, Self-Esteem/Confidence, Social Interaction, Stress Management, Time Management  Leisure Interests (2+):  Individual - Computer, Individual - TV, Art - Draw  Awareness of Community Resources:  No  Community Resources:     Current Use:    If no, Barriers?:    Patient Strengths:  Good person, care for others, good  work performance  Patient Identified Areas of Improvement:  Talking to others more  Current Recreation Participation:  Kayaking  Patient Goal for Hospitalization:  Trying to realize what I really need to do.  [place]  County [place] Residence:  [place]   Current SI (including self-harm):  No  Current HI:  No  Consent to Intern Participation: N/A   Heylee Tant 03/20/2017, 2:29 PM

## 2017-03-20 NOTE — Progress Notes (Signed)
Blood sugar at bedtime was 288.

## 2017-03-20 NOTE — Care Management (Addendum)
RNCM consult received to assist patient with PCP arrangement. Patient is listed as not having health insurance. Per my previous encounter with patient on ICU unit, patient was referred to [facility] and Medication management. Patient should still be able to access this resource.  They cannot provide psychotropic assistance. I see several no-shows in FYI. RNCM received notification that patient is open to medication management however he never established with [facility] however this is still an opportunity for him. RNCM may be able to provide patient with glucometer at time of discharge. Please also provided patient with a prescription for glucometer and supplies.

## 2017-03-20 NOTE — Plan of Care (Signed)
Patient progressing in all areas. 

## 2017-03-20 NOTE — Progress Notes (Addendum)
MD- Patient will need CBG meter at time of d/c.    Order # [id number]   Met with patient today in the [facility] unit to discuss his History of DM.  Patient stated he was never really told he had DM, but that doctors have told him he was "pre-diabetic" in the past.  Has never taken any medications for his blood sugars.  Does not have CBG meter at home.  Stated he has blood pressure issues but never had symptoms of high blood pressure.  Has not had any symptoms of hyperglycemia either.  Spoke with pt about diabetes.  Discussed A1C results with him and explained what an A1C is, basic pathophysiology of DM Type 2, basic home care, basic diabetes diet nutrition principles, importance of checking CBGs and maintaining good CBG control to prevent long-term and short-term complications.  Also reviewed blood sugar goals and A1c goals for home.    RNs to provide ongoing basic DM education at bedside with this patient.  Have ordered educational booklet and have asked RNs to allow pt to practice checking his own CBGs.  Have also placed RD consult for DM diet education for this patient.      --Will follow patient during hospitalization--  Wyn Quaker RN, MSN, CDE Diabetes Coordinator Inpatient Glycemic Control Team Team Pager: [phone] (8a-5p)

## 2017-03-20 NOTE — Progress Notes (Signed)
Pt was pleasant and cooperative on approach. He spent most of the evening in the dayroom watching t.v with peers. He was medication complaint. He interacted well with peers. He appears to in bed resting quietly at this time. Will continue to monitor behaviors.

## 2017-03-20 NOTE — Progress Notes (Signed)
Recreation Therapy Notes  Date: 01.07.2019  Time: 1:00pm  Location: Craft Room  Behavioral response: Appropriate  Intervention Topic: Problem Solving  Discussion/Intervention: Group content on today was focused on problem solving. The group described what problem solving is. Patients expressed how problems affect them and how they deal with problems. Individuals identified healthy ways to deal with problems. Patients explained what normally happens to them when they do not deal with problems. The group expressed reoccurring problems for them. The group participated in the intervention "Put the story together" with their peers and worked together to put a story that was broken up in the correct order. Clinical Observations/Feedback:  Patient came to group and defined problem solving as thinking of more than one solution. He expressed that he listens to music to help with problem solving. Individual participated in the intervention and was social with his peers and staff during group.  Larkyn Greenberger LRT/CTRS         Saidah Kempton 03/20/2017 2:53 PM

## 2017-03-21 LAB — GLUCOSE, CAPILLARY
GLUCOSE-CAPILLARY: 245 mg/dL — AB (ref 65–99)
GLUCOSE-CAPILLARY: 232 mg/dL — AB (ref 65–99)
Glucose-Capillary: 244 mg/dL — ABNORMAL HIGH (ref 65–99)
Glucose-Capillary: 263 mg/dL — ABNORMAL HIGH (ref 65–99)

## 2017-03-21 MED ORDER — INSULIN GLARGINE 100 UNIT/ML ~~LOC~~ SOLN
25.0000 [IU] | Freq: Every day | SUBCUTANEOUS | Status: DC
  Administered 2017-03-21 – 2017-03-22 (×2): 25 [IU] via SUBCUTANEOUS
  Filled 2017-03-21 (×3): qty 0.25

## 2017-03-21 MED ORDER — INSULIN ASPART 100 UNIT/ML ~~LOC~~ SOLN
0.0000 [IU] | Freq: Every day | SUBCUTANEOUS | Status: DC
  Administered 2017-03-21: 5 [IU] via SUBCUTANEOUS
  Filled 2017-03-21: qty 1

## 2017-03-21 MED ORDER — INSULIN ASPART 100 UNIT/ML ~~LOC~~ SOLN
0.0000 [IU] | Freq: Three times a day (TID) | SUBCUTANEOUS | Status: DC
  Administered 2017-03-21 (×2): 7 [IU] via SUBCUTANEOUS
  Administered 2017-03-22: 4 [IU] via SUBCUTANEOUS
  Administered 2017-03-22: 3 [IU] via SUBCUTANEOUS
  Administered 2017-03-22: 4 [IU] via SUBCUTANEOUS
  Filled 2017-03-21 (×3): qty 1

## 2017-03-21 NOTE — Progress Notes (Signed)
Inpatient Diabetes Program Recommendations  AACE/ADA: New Consensus Statement on Inpatient Glycemic Control (2015)  Target Ranges:  Prepandial:   less than 140 mg/dL      Peak postprandial:   less than 180 mg/dL (1-2 hours)      Critically ill patients:  140 - 180 mg/dL   Lab Results  Component Value Date   GLUCAP 245 (H) 03/21/2017   HGBA1C 12.7 (H) 03/20/2017    Spoke to Case Management in behavioral health regarding information needed at [facility] Management clinic (see previous note)- Case Management is awaiting paperwork from the patient  to determine what insulins are available for discharge.   In order to expedite insulin teaching (pens or syringe) I have contacted Medication Management myself- I have not heard from them back yet.  Met with patient in the unit.  He was attentive and tells me has already begun looking through the Living Well with Diabetes book we have ordered for him.  Reviewed the basic physiology regarding diabetes.  He reports that he tends to only eat one meal a day and stays up all night while his wife works, so he can sleep with her during the day.  I have reviewed the plate method of eating with the patient and have instructed him not to skip meals. I have instructed him to avoid juice (which he love).  Patient is receptive to starting an exercise program.  Carlos [name] is anxious to leave tomorrow but I have discussed with him, the need to assess insulin needs before discharge.  We also discussed his erectile dysfunction and I have encouraged him to get his blood sugars under control to help decrease complications including this.  Case Management has a Reli-On  Meter for him (he has checked his blood sugar in the past) and he has seen his friend use the insulin pen. Instructed patient to only use his abdomen for insulin administration.  For now, RN Junita Push will have patient check his own blood sugar and give his own insulin this evening.   Consult for RD ordered.   Carlos Fitz, RN, BA, MHA, CDE Diabetes Coordinator Inpatient Diabetes Program  [phone] (Team Pager) [phone] ([place]) 03/21/2017 2:14 PM

## 2017-03-21 NOTE — Progress Notes (Signed)
CM Note;  Met with patient and Dr. Bary Leriche re: Glucometer and aftercare appointment. Reviewed how to use Glucometer with patient. Glucometer given to Staff Nurse GiGi Maniattu to secure until patient is discharged. Patient encouraged to complete application for [facility] and return to his [place].

## 2017-03-21 NOTE — Plan of Care (Signed)
  Progressing Education: Knowledge of General Education information will improve 03/21/2017 2340 - Progressing by Lelan PonsAriwodo, Viral Schramm, RN Health Behavior/Discharge Planning: Ability to manage health-related needs will improve 03/21/2017 2340 - Progressing by Lelan PonsAriwodo, Adie Vilar, RN Coping: Level of anxiety will decrease 03/21/2017 2340 - Progressing by Lelan PonsAriwodo, Laurna Shetley, RN Pain Managment: General experience of comfort will improve 03/21/2017 2340 - Progressing by Lelan PonsAriwodo, Tyronza Happe, RN Safety: Ability to remain free from injury will improve 03/21/2017 2340 - Progressing by Lelan PonsAriwodo, TRUE Shackleford, RN Education: Utilization of techniques to improve thought processes will improve 03/21/2017 2340 - Progressing by Lelan PonsAriwodo, Cloys Vera, RN Knowledge of the prescribed therapeutic regimen will improve 03/21/2017 2340 - Progressing by Lelan PonsAriwodo, Kosisochukwu Burningham, RN

## 2017-03-21 NOTE — Progress Notes (Signed)
Recreation Therapy Notes  INPATIENT RECREATION TR PLAN  Patient Details Name: Carlos [name] MRN: [id number] DOB: 1985/08/01 Today's Date: 03/21/2017  Rec Therapy Plan Is patient appropriate for Therapeutic Recreation?: Yes Treatment times per week: At least 3 Estimated Length of Stay: 5-7 days TR Treatment/Interventions: Group participation (Comment)  Discharge Criteria Pt will be discharged from therapy if:: Discharged Treatment plan/goals/alternatives discussed and agreed upon by:: Patient/family  Discharge Summary Short term goals set: Patient will identify 3 triggers for depressive symptoms x5 days.  Short term goals met: Adequate for discharge Progress toward goals comments: Groups attended Which groups?: Other (Comment)(Problem Solving, Team work) Reason goals not met: N/A Therapeutic equipment acquired: N/A Reason patient discharged from therapy: Discharge from hospital Pt/family agrees with progress & goals achieved: Yes Date patient discharged from therapy: 03/21/17   Keryn Nessler 03/21/2017, 2:38 PM

## 2017-03-21 NOTE — [facility] Group Notes (Signed)
03/21/2017 0930  Type of Therapy/Topic:  Group Therapy:  Feelings about Diagnosis  Participation Level:  Active   Description of Group:   This group will allow patients to explore their thoughts and feelings about diagnoses they have received. Patients will be guided to explore their level of understanding and acceptance of these diagnoses. Facilitator will encourage patients to process their thoughts and feelings about the reactions of others to their diagnosis and will guide patients in identifying ways to discuss their diagnosis with significant others in their lives. This group will be process-oriented, with patients participating in exploration of their own experiences, giving and receiving support, and processing challenge from other group members.   Therapeutic Goals: 1. Patient will demonstrate understanding of diagnosis as evidenced by identifying two or more symptoms of the disorder 2. Patient will be able to express two feelings regarding the diagnosis 3. Patient will demonstrate their ability to communicate their needs through discussion and/or role play  Summary of Patient Progress: Pt continues to work towards their tx goals but has not yet reached them. Pt was able to appropriately participate in group discussion, and was able to offer support/validation to other group members. Pt reported he has received several diagnosis over his lifetime, but he reported he agrees with most of them. He stated, "I have experienced stigma but honestly I just tell people all about it and if they can't handle it I tell them they may not be able to be in my life." Pt reported understanding his diagnosis and stated, "when I was first diagnosed six years ago, I was fearful. But, now, I feel like I understand it and I feel comfortable with it. I know what I need to do in order to be a better husband and a better friend."    Therapeutic Modalities:   Cognitive Behavioral Therapy Brief Therapy   Heidi DachKelsey  Adeyemi Hamad, MSW, LCSW 03/21/2017 10:24 AM

## 2017-03-21 NOTE — Progress Notes (Signed)
Patient ID: Carlos [name], male   DOB: 03-27-1985, 32 y.o.   MRN: [id number] Visitation by family Dot [place](Krista and VamoKayrin) went well according to [facility]; CBG=240 @ bedtime; pleasant, mood and affect appropriate, polite, denied SI/HI/AVH.

## 2017-03-21 NOTE — Progress Notes (Signed)
Inpatient Diabetes Program Recommendations  AACE/ADA: New Consensus Statement on Inpatient Glycemic Control (2015)  Target Ranges:  Prepandial:   less than 140 mg/dL      Peak postprandial:   less than 180 mg/dL (1-2 hours)      Critically ill patients:  140 - 180 mg/dL   Lab Results  Component Value Date   GLUCAP 245 (H) 03/21/2017   HGBA1C 12.7 (H) 03/20/2017    Review of Glycemic Control  Results for Carlos [name], Carlos DAVID "[name]" (MRN [id number]) as of 03/21/2017 09:52  Ref. Range 03/20/2017 08:51 03/20/2017 11:40 03/20/2017 16:34 03/20/2017 21:05 03/21/2017 07:05  Glucose-Capillary Latest Ref Range: 65 - 99 mg/dL 629308 (H) 528299 (H) 413206 (H) 240 (H) 245 (H)    Diabetes history: Type 2 Outpatient Diabetes medications: none noted  Current orders for Inpatient glycemic control: Novolog 0-9 units tid, Metformin 1000mg  bid  Inpatient Diabetes Program Recommendations:   Results for Pogorzelski, Malin DAVID "Sharia ReeveJOSH" (MRN [id number]) as of 03/21/2017 09:52  Ref. Range 03/20/2017 08:51 03/20/2017 11:40 03/20/2017 16:34 03/20/2017 21:05 03/21/2017 07:05  Glucose-Capillary Latest Ref Range: 65 - 99 mg/dL 536308 (H) 644299 (H) 034206 (H) 240 (H) 245 (H)    Consider adding Lantus 25 units qhs (0.2units/kg), consider increasing Novolog to 0-15 units tid.  And add Novolog 0-5 units qhs.  Spoke to Dr. Jennet MaduroPucilowska regarding recommendations for insulin at home.  She will ask Case Management to get involved so we can determine what insulins are available at the [facility] so we can order appropriately- would like to have rapid acting (Novolog, Humalog or Apidra) and long-acting insulin (Lantus or Levemir) options (in pen form preferably).  Once we know what medications are available, the MD can write orders and I can teach him how to use the insulin- please contact me when we know what is available)  Susette RacerJulie Shanda Cadotte, RN, BA, MHA, CDE Diabetes Coordinator Inpatient Diabetes Program  [phone] (Team  Pager) [phone] [facility] Office) 03/21/2017 9:59 AM

## 2017-03-21 NOTE — Plan of Care (Signed)
Patient is progressing in all areas. 

## 2017-03-21 NOTE — Progress Notes (Signed)
[facility] MD Progress Note  03/21/2017 1:56 PM Carlos [name]  MRN:  [id number]  Subjective:   Carlos [name] wants to be discharged today but was explained that he has to remain in the hospital intil we check Depakote level. He has no health insurance and could not afford to do it in the community. WE had a long discussion about his diabetes. He has never been on insulin. His BS are high, HgbA1C is over 10 and Cr 1.6. He agreed to start Insulin. He was somewhat tearful during the interview worried about his wife who no longer is in the hospital but may be unfaithful now.  Spoke with Carlos [name], [facility] who will guide transition to insulin. Spoke with Carlos [name], case manager about medical follow up.   Treatment plan. Continue Depakote 500 mg TID. Level in 2 days and Wellbutrin for depression and mood stabilization. Start Lantus 25 units daily and continue SSI.  Social/disposition. He will return with his wife. [facility]. Follow up with [facility] for psychiatric medication management.  Principal Problem: Major depressive disorder, recurrent severe without psychotic features (HCC) Diagnosis:   Patient Active Problem List   Diagnosis Date Noted  . Major depressive disorder, recurrent severe without psychotic features (HCC) [F33.2] 03/19/2017    Priority: High  . Cannabis use disorder, moderate, dependence (HCC) [F12.20]   . Chronic posttraumatic stress disorder [F43.12]   . Generalized abdominal pain [R10.84] 01/31/2017  . Depression [F32.9] 01/31/2017  . Stage 3 chronic kidney disease (HCC) [N18.3] 06/15/2016  . H/O medication noncompliance [Z91.14] 06/07/2016  . Obesity [E66.9] 06/07/2016  . Erectile dysfunction [N52.9] 06/07/2016  . Type 2 diabetes mellitus with complication (HCC) [E11.8] 06/07/2016  . Hypertensive urgency [I16.0] 06/06/2016  . Chest pain [R07.9] 05/23/2013  . SOB (shortness of breath) [R06.02] 05/23/2013  . Essential  hypertension [I10]    Total Time spent with patient: 45 minutes  Past Psychiatric History: bipolar depression  Past Medical History:  Past Medical History:  Diagnosis Date  . H/O medication noncompliance   . Hyperlipidemia   . Hypertension   . Kidney stones   . Lung nodule   . Nephrolithiasis   . Obesity   . Type 2 diabetes mellitus with complication (HCC) 06/07/2016    Past Surgical History:  Procedure Laterality Date  . KIDNEY STONE SURGERY    . TONSILLECTOMY     Family History:  Family History  Problem Relation Age of Onset  . Heart failure Mother   . Hypertension Mother   . Lung cancer Mother   . Brain cancer Father   . Lung cancer Father   . Diabetes Sister   . Autism Brother    Family Psychiatric  History: substance abuse Social History:  Social History   Substance and Sexual Activity  Alcohol Use No   Comment: occasional     Social History   Substance and Sexual Activity  Drug Use Yes  . Frequency: 7.0 times per week  . Types: Marijuana    Social History   Socioeconomic History  . Marital status: Significant Other    Spouse name: None  . Number of children: None  . Years of education: None  . Highest education level: None  Social Needs  . Financial resource strain: None  . Food insecurity - worry: None  . Food insecurity - inability: None  . Transportation needs - medical: None  . Transportation needs - non-medical: None  Occupational History  .  None  Tobacco Use  . Smoking status: Former Smoker    Packs/day: 0.50    Years: 1.00    Pack years: 0.50    Types: Cigarettes    Last attempt to quit: 2017    Years since quitting: 2.0  . Smokeless tobacco: Never Used  Substance and Sexual Activity  . Alcohol use: No    Comment: occasional  . Drug use: Yes    Frequency: 7.0 times per week    Types: Marijuana  . Sexual activity: Yes    Partners: Female    Birth control/protection: None  Other Topics Concern  . None  Social History  Narrative  . None   Additional Social History:    Pain Medications: none Prescriptions: none Over the Counter: none History of alcohol / drug use?: No history of alcohol / drug abuse                    Sleep: Fair  Appetite:  Fair  Current Medications: Current Facility-Administered Medications  Medication Dose Route Frequency Provider Last Rate Last Dose  . acetaminophen (TYLENOL) tablet 650 mg  650 mg Oral Q6H PRN Darliss RidgelKapur, Aarti K, MD      . alum & mag hydroxide-simeth (MAALOX/MYLANTA) 200-200-20 MG/5ML suspension 30 mL  30 mL Oral Q4H PRN Darliss RidgelKapur, Aarti K, MD      . amLODipine (NORVASC) tablet 10 mg  10 mg Oral Daily Darliss RidgelKapur, Aarti K, MD   10 mg at 03/21/17 0745  . buPROPion (WELLBUTRIN XL) 24 hr tablet 150 mg  150 mg Oral Daily Pucilowska, Jolanta B, MD   150 mg at 03/21/17 0745  . divalproex (DEPAKOTE) DR tablet 500 mg  500 mg Oral Q8H Pucilowska, Jolanta B, MD   500 mg at 03/21/17 0630  . hydrOXYzine (VISTARIL) capsule 25 mg  25 mg Oral TID PRN Darliss RidgelKapur, Aarti K, MD      . insulin aspart (novoLOG) injection 0-20 Units  0-20 Units Subcutaneous TID WC Pucilowska, Jolanta B, MD   7 Units at 03/21/17 1201  . insulin aspart (novoLOG) injection 0-5 Units  0-5 Units Subcutaneous QHS Pucilowska, Jolanta B, MD      . insulin glargine (LANTUS) injection 25 Units  25 Units Subcutaneous Daily Pucilowska, Jolanta B, MD   25 Units at 03/21/17 1208  . lisinopril (PRINIVIL,ZESTRIL) tablet 20 mg  20 mg Oral Daily Darliss RidgelKapur, Aarti K, MD   20 mg at 03/21/17 0745  . magnesium hydroxide (MILK OF MAGNESIA) suspension 30 mL  30 mL Oral Daily PRN Darliss RidgelKapur, Aarti K, MD      . metFORMIN (GLUCOPHAGE) tablet 1,000 mg  1,000 mg Oral Q breakfast Darliss RidgelKapur, Aarti K, MD   1,000 mg at 03/21/17 0745  . traZODone (DESYREL) tablet 100 mg  100 mg Oral QHS Pucilowska, Jolanta B, MD   100 mg at 03/20/17 2113    Lab Results:  Results for orders placed or performed during the hospital encounter of 03/19/17 (from the past 48  hour(s))  Glucose, capillary     Status: Abnormal   Collection Time: 03/19/17  4:17 PM  Result Value Ref Range   Glucose-Capillary 350 (H) 65 - 99 mg/dL  Glucose, capillary     Status: Abnormal   Collection Time: 03/20/17 12:32 AM  Result Value Ref Range   Glucose-Capillary 288 (H) 65 - 99 mg/dL  Lipid panel     Status: Abnormal   Collection Time: 03/20/17  6:49 AM  Result Value Ref Range  Cholesterol 206 (H) 0 - 200 mg/dL   Triglycerides 161 (H) <150 mg/dL   HDL 33 (L) >09 mg/dL   Total CHOL/HDL Ratio 6.2 RATIO   VLDL UNABLE TO CALCULATE IF TRIGLYCERIDE OVER 400 mg/dL 0 - 40 mg/dL   LDL Cholesterol UNABLE TO CALCULATE IF TRIGLYCERIDE OVER 400 mg/dL 0 - 99 mg/dL    Comment:        Total Cholesterol/HDL:CHD Risk Coronary Heart Disease Risk Table                     Men   Women  1/2 Average Risk   3.4   3.3  Average Risk       5.0   4.4  2 X Average Risk   9.6   7.1  3 X Average Risk  23.4   11.0        Use the calculated Patient Ratio above and the CHD Risk Table to determine the patient's CHD Risk.        ATP III CLASSIFICATION (LDL):  <100     mg/dL   Optimal  604-540  mg/dL   Near or Above                    Optimal  130-159  mg/dL   Borderline  981-191  mg/dL   High  >478     mg/dL   Very High Performed at [facility], [address] [place], [place] [address]   Vitamin B12     Status: None   Collection Time: 03/20/17  6:49 AM  Result Value Ref Range   Vitamin B-12 426 180 - 914 pg/mL    Comment: (NOTE) This assay is not validated for testing neonatal or myeloproliferative syndrome specimens for Vitamin B12 levels. Performed at [facility], [address], [place], [place] [address]   TSH     Status: None   Collection Time: 03/20/17  6:49 AM  Result Value Ref Range   TSH 1.950 0.350 - 4.500 uIU/mL    Comment: Performed by a 3rd Generation assay with a functional sensitivity of <=0.01 uIU/mL. Performed at [facility], [address] [place], [place] [address]   Hemoglobin A1c     Status: Abnormal   Collection Time: 03/20/17  6:49 AM  Result Value Ref Range   Hgb A1c MFr Bld 12.7 (H) 4.8 - 5.6 %    Comment: (NOTE) Pre diabetes:          5.7%-6.4% Diabetes:              >6.4% Glycemic control for   <7.0% adults with diabetes    Mean Plasma Glucose 317.79 mg/dL    Comment: Performed at [facility], [address], [place], [place] [address]  Glucose, capillary     Status: Abnormal   Collection Time: 03/20/17  7:08 AM  Result Value Ref Range   Glucose-Capillary 250 (H) 65 - 99 mg/dL   Comment 1 Notify RN   Glucose, capillary     Status: Abnormal   Collection Time: 03/20/17  8:51 AM  Result Value Ref Range   Glucose-Capillary 308 (H) 65 - 99 mg/dL  Glucose, capillary     Status: Abnormal   Collection Time: 03/20/17 11:40 AM  Result Value Ref Range   Glucose-Capillary 299 (H) 65 - 99 mg/dL   Comment 1 Notify RN   Glucose, capillary     Status: Abnormal  Collection Time: 03/20/17  4:34 PM  Result Value Ref Range   Glucose-Capillary 206 (H) 65 - 99 mg/dL  Glucose, capillary     Status: Abnormal   Collection Time: 03/20/17  9:05 PM  Result Value Ref Range   Glucose-Capillary 240 (H) 65 - 99 mg/dL   Comment 1 Notify RN   Glucose, capillary     Status: Abnormal   Collection Time: 03/21/17  7:05 AM  Result Value Ref Range   Glucose-Capillary 245 (H) 65 - 99 mg/dL   Comment 1 Notify RN   Glucose, capillary     Status: Abnormal   Collection Time: 03/21/17 11:34 AM  Result Value Ref Range   Glucose-Capillary 244 (H) 65 - 99 mg/dL   Comment 1 Notify RN     Blood Alcohol level:  Lab Results  Component Value Date   ETH <10 03/18/2017    Metabolic Disorder Labs: Lab Results  Component Value Date   HGBA1C 12.7 (H) 03/20/2017   MPG 317.79 03/20/2017   MPG 189 06/06/2016   No results found for: PROLACTIN Lab Results  Component Value Date   CHOL 206 (H) 03/20/2017   TRIG 479 (H) 03/20/2017    HDL 33 (L) 03/20/2017   CHOLHDL 6.2 03/20/2017   VLDL UNABLE TO CALCULATE IF TRIGLYCERIDE OVER 400 mg/dL 16/12/9602   LDLCALC UNABLE TO CALCULATE IF TRIGLYCERIDE OVER 400 mg/dL 54/11/8117   LDLCALC 86 02/06/2013    Physical Findings: AIMS:  , ,  ,  ,    CIWA:    COWS:     Musculoskeletal: Strength & Muscle Tone: within normal limits Gait & Station: normal Patient leans: N/A  Psychiatric Specialty Exam: Physical Exam  Nursing note and vitals reviewed. Psychiatric: His speech is normal. Thought content normal. He is withdrawn. Cognition and memory are normal. He expresses impulsivity. He exhibits a depressed mood.    Review of Systems  Neurological: Negative.   Psychiatric/Behavioral: Positive for depression.  All other systems reviewed and are negative.   Blood pressure (!) 140/91, pulse 77, temperature 97.7 F (36.5 C), temperature source Oral, resp. rate 18, height 5\' 6"  (1.676 m), weight 127 kg (280 lb), SpO2 99 %.Body mass index is 45.19 kg/m.  General Appearance: Casual  Eye Contact:  Good  Speech:  Clear and Coherent  Volume:  Normal  Mood:  Depressed and Hopeless  Affect:  Tearful  Thought Process:  Goal Directed and Descriptions of Associations: Intact  Orientation:  Full (Time, Place, and Person)  Thought Content:  WDL  Suicidal Thoughts:  No  Homicidal Thoughts:  No  Memory:  Immediate;   Fair Recent;   Fair Remote;   Fair  Judgement:  Poor  Insight:  Shallow  Psychomotor Activity:  Psychomotor Retardation  Concentration:  Concentration: Fair and Attention Span: Fair  Recall:  [employer] of Knowledge:  Fair  Language:  Fair  Akathisia:  No  Handed:  Right  AIMS (if indicated):     Assets:  Communication Skills Desire for Improvement Housing Resilience Social Support  ADL's:  Intact  Cognition:  WNL  Sleep:  Number of Hours: 7.15     Treatment Plan Summary: Daily contact with patient to assess and evaluate symptoms and progress in treatment  and Medication management   Mr. Fessel is a 32 year old male with a history of recurrent depression, anxiety, PTSD and cannabis use who came to the emergency room after a suicide attempt. He tried to park his car in front of  a truck to kill himself.   #Suicidal ideation, resolved -patient is able to contract for safety in the hospital  #Mood, improving -continue Depakote 500 mg TID, VPA level 1/10 -Wellbutrin 150 mg daily    #Substance abuse -patient minimizes problems and declines treatment  #DM -continue Metformin 1000 mg BID -Lantus 25 units dailt -ADA diet, SSI, BS monitoring   #HTN -continue Norvasc 10 mg and Lisinopril 20 mg daily   #Metabolic syndrome monitoring -Lipids are elevated with TG 478, HgbA1C 12,6  #Disposition -discharge to home -follow up with [facility] as he no longer can afford [facility]     Kristine Linea, MD 03/21/2017, 1:56 PM

## 2017-03-21 NOTE — Plan of Care (Signed)
Patient slept for Estimated Hours of 7.15; Precautionary checks every 15 minutes for safety maintained, room free of safety hazards, patient sustains no injury or falls during this shift.  

## 2017-03-21 NOTE — Progress Notes (Signed)
Nutrition Education Note   RD consulted for nutrition education regarding diabetes.   Lab Results  Component Value Date   HGBA1C 12.7 (H) 03/20/2017    RD provided "Nutrition and Type II Diabetes" handout from the Academy of Nutrition and Dietetics. Discussed different food groups and their effects on blood sugar, emphasizing carbohydrate-containing foods. Provided list of carbohydrates and recommended serving sizes of common foods.  Discussed importance of controlled and consistent carbohydrate intake throughout the day. Provided examples of ways to balance meals/snacks and encouraged intake of high-fiber, whole grain complex carbohydrates. Teach back method used.  Expect good compliance.  Body mass index is 45.19 kg/m. Pt meets criteria for morbid obesity based on current BMI.  Current diet order is regular, patient is consuming approximately 100% of meals at this time. Labs and medications reviewed. No further nutrition interventions warranted at this time. RD contact information provided. If additional nutrition issues arise, please re-consult RD.  Betsey Holidayasey Sherrin Stahle MS, RD, LDN Pager #- [phone] After Hours Pager: [phone]

## 2017-03-21 NOTE — Progress Notes (Signed)
Recreation Therapy Notes  Date: 01.08.2019  Time: 1:00 PM  Location: Craft Room  Behavioral response: Appropriate  Intervention Topic: Team work  Discussion/Intervention: Group content on today was focused on teamwork. The group identified what teamwork is. Individuals described who is a part of their team. Patients expressed why they thought teamwork is important. The group stated reasons why they thought it was easier to work with a Comptrollersmaller/larger team. Individuals discussed some positives and negatives of working with a team. Patients gave examples of past experiences they had while working with a team. The group participated in the intervention "Save the BlasdellBalls", patients were in groups and had to keep the balls on the surface given.  Clinical Observations/Feedback:  Patient came to group and shared an experience he had while working in a team at work [employer]). He stated that some negative aspects of working in a team is weak links. Individual stated things run smoother when working as a team. Patient was pulled from group by nurse and was not able to return. Carlos [name] LRT/CTRS         [name] 03/21/2017 2:12 PM

## 2017-03-21 NOTE — Progress Notes (Signed)
Patient was upset and crying for not getting discharge today.Denies SI,HI and AVH.Pleasant,cooerative and receptive for the educations given about insulin injections.Appropriate with staff and peers.Compliant with medications.Attended groups.Appetite and energy level good.Support and encouragement given.

## 2017-03-22 LAB — GLUCOSE, CAPILLARY
GLUCOSE-CAPILLARY: 165 mg/dL — AB (ref 65–99)
Glucose-Capillary: 191 mg/dL — ABNORMAL HIGH (ref 65–99)
Glucose-Capillary: 134 mg/dL — ABNORMAL HIGH (ref 65–99)
Glucose-Capillary: 121 mg/dL — ABNORMAL HIGH (ref 65–99)

## 2017-03-22 MED ORDER — INSULIN GLARGINE 100 UNIT/ML ~~LOC~~ SOLN
5.0000 [IU] | Freq: Once | SUBCUTANEOUS | Status: AC
  Administered 2017-03-22: 5 [IU] via SUBCUTANEOUS
  Filled 2017-03-22: qty 0.05

## 2017-03-22 MED ORDER — TRAZODONE HCL 100 MG PO TABS: 100.0000 mg | ORAL_TABLET | Freq: Every day | ORAL | 1 refills | Status: DC

## 2017-03-22 MED ORDER — METFORMIN HCL 1000 MG PO TABS: 1000.0000 mg | ORAL_TABLET | Freq: Every day | ORAL | 1 refills | Status: DC

## 2017-03-22 MED ORDER — INSULIN GLARGINE 100 UNIT/ML ~~LOC~~ SOLN
30.0000 [IU] | Freq: Every day | SUBCUTANEOUS | Status: DC
  Administered 2017-03-23: 30 [IU] via SUBCUTANEOUS
  Filled 2017-03-22 (×2): qty 0.3

## 2017-03-22 MED ORDER — DIVALPROEX SODIUM 500 MG PO DR TAB: 500.0000 mg | DELAYED_RELEASE_TABLET | Freq: Three times a day (TID) | ORAL | 1 refills | Status: DC

## 2017-03-22 MED ORDER — INSULIN ASPART 100 UNIT/ML ~~LOC~~ SOLN: SUBCUTANEOUS | 3 refills | Status: DC

## 2017-03-22 MED ORDER — INSULIN ASPART 100 UNIT/ML ~~LOC~~ SOLN
0.0000 [IU] | Freq: Three times a day (TID) | SUBCUTANEOUS | Status: DC
  Administered 2017-03-22 – 2017-03-23 (×3): 2 [IU] via SUBCUTANEOUS
  Filled 2017-03-22: qty 1

## 2017-03-22 MED ORDER — INSULIN GLARGINE 100 UNIT/ML ~~LOC~~ SOLN: 30.0000 [IU] | Freq: Every day | SUBCUTANEOUS | 11 refills | Status: DC

## 2017-03-22 MED ORDER — AMLODIPINE BESYLATE 10 MG PO TABS: 10.0000 mg | ORAL_TABLET | Freq: Every day | ORAL | 1 refills | Status: DC

## 2017-03-22 MED ORDER — BUPROPION HCL ER (XL) 150 MG PO TB24: 150.0000 mg | ORAL_TABLET | Freq: Every day | ORAL | 1 refills | Status: DC

## 2017-03-22 MED ORDER — LISINOPRIL 20 MG PO TABS: 20.0000 mg | ORAL_TABLET | Freq: Every day | ORAL | 1 refills | Status: DC

## 2017-03-22 NOTE — Progress Notes (Signed)
  [facility] Dis[facility] :  Will you be returning to the same living situation after discharge:  Yes,    At discharge, do you have transportation home?: Yes,    Do you have the ability to pay for your medications: No.  Release of information consent forms completed and in the chart;  Patient's signature needed at discharge.  Patient to Follow up at: Follow-up Information    [facility] [place]. Schedule an appointment as soon as possible for a visit.   Specialty:  Nutrition & Diabetics Contact information: [address] [id number] [place] [id number] [phone]       [facility] Go on 03/24/2017.   Why:  7:15am, for Hospital follow up and  Peer Support Services.  Please keep in touch with Mr. Unk PintoHarvey Bryant at [phone] Contact information: [address] [place] [id number] [phone]           Next level of care provider has access to [facility] Link:no  Safety Planning and Suicide Prevention discussed: Yes,     Have you used any form of tobacco in the last 30 days? (Cigarettes, Smokeless Tobacco, Cigars, and/or Pipes): No  Has patient been referred to the Quitline?: N/A patient is not a smoker  Patient has been referred for addiction treatment: Yes  Glennon MacSara P Chiyo Fay, LCSW 03/22/2017, 9:33 AM

## 2017-03-22 NOTE — Progress Notes (Signed)
Recreation Therapy Notes   Date: 01.09.2019  Time: 1:00PM  Location: Craft Room  Behavioral response: Appropriate  Intervention Topic: Creative Expressions  Discussion/Intervention: Group content on today was focused on creative expressions. The group defined creative expressions and ways they use creative expressions. Individual identified other positive ways creative expressions can be used and why it is important to express yourself. Patients participated in the intervention "learning origami", where they had a chance to learn new ways to creatively express themselves. Clinical Observations/Feedback:  Patient came to group and defined creative expressions could be origami or drawing. He stated that he uses music and making up new things to creatively express himself. Individual participated in the intervention na d continues to work toward his goal.  Adult nursehaverence Mackenzie Groom LRT/CTRS         Bricia Taher 03/22/2017 2:19 PM

## 2017-03-22 NOTE — Progress Notes (Signed)
Patient is sleeping long hours without any interruption, 15 minute check is in progress no acute respiratory distress noted, patient endorsed safety of  self and others and denies and thoughts  Of suicide ideation or AVH at this time.
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
## 2017-03-22 NOTE — [facility] Suicide Risk Assessment (Signed)
[facility] INPATIENT:  Family/Significant Other Suicide Prevention Education  Suicide Prevention Education:  Education Completed; Community education officerKrista Kime, Wife,  (name of family member/significant other) has been identified by the patient as the family member/significant other with whom the patient will be residing, and identified as the person(s) who will aid the patient in the event of a mental health crisis (suicidal ideations/suicide attempt).  With written consent from the patient, the family member/significant other has been provided the following suicide prevention education, prior to the and/or following the discharge of the patient.  The suicide prevention education provided includes the following:  Suicide risk factors  Suicide prevention and interventions  National Suicide Hotline telephone number  [facility] assessment telephone number  [facility] 911  [facility] and/or [facility]idential [facility] telephone number  Request made of family/significant other to:  Remove weapons (e.g., guns, rifles, knives), all items previously/currently identified as safety concern.    Remove drugs/medications (over-the-counter, prescriptions, illicit drugs), all items previously/currently identified as a safety concern.  The family member/significant other verbalizes understanding of the suicide prevention education information provided.  The family member/significant other agrees to remove the items of safety concern listed above.  Cleda DaubSara P Braylin Xu, LCSW 03/22/2017, 9:19 AM

## 2017-03-22 NOTE — Plan of Care (Addendum)
Patient is alert and oriented X 4. Patient denies SI, HI and AVH. Patient is pleasant and compliant with medications. Patient attends groups and is appropriate with staff and peers. Patient education on medications provided with teach back and demonstration with insulins. Diabetic coordinator met with patient today to provide information on using insulin pen . Patient was able to draw up insulin and give injection to himself without any issues. Patient blood glucose levels  today have been managed with sliding scale, novolog and Lantus. This morning Blood glucose level 191; lunch time Blood glucose level 165. Patient received in total 30 units of Lantus today. Safety checks continue Q 15 minutes. Nurse will continue to monitor. Education: Knowledge of General Education information will improve 03/22/2017 1417 - Progressing by Geraldo Docker, RN   Health Behavior/Discharge Planning: Ability to manage health-related needs will improve 03/22/2017 1417 - Progressing by Geraldo Docker, RN   Clinical Measurements: Ability to maintain clinical measurements within normal limits will improve 03/22/2017 1417 - Progressing by Geraldo Docker, RN Will remain free from infection 03/22/2017 1417 - Progressing by Geraldo Docker, RN Diagnostic test results will improve 03/22/2017 1417 - Progressing by Geraldo Docker, RN Respiratory complications will improve 03/22/2017 1417 - Progressing by Geraldo Docker, RN Cardiovascular complication will be avoided 03/22/2017 1417 - Progressing by Geraldo Docker, RN   Education: Ability to describe self-care measures that may prevent or decrease complications (Diabetes Survival Skills Education) will improve 03/22/2017 1417 - Progressing by Geraldo Docker, RN

## 2017-03-22 NOTE — [facility] Group Notes (Signed)
[facility] Group Notes:  (Nursing/MHT/Case Management/Adjunct)  Date:  03/22/2017  Time:  11:45 PM  Type of Therapy:  Group Therapy  Participation Level:  Active  Participation Quality:  Appropriate  Affect:  Appropriate  Cognitive:  Alert  Insight:  Good  Engagement in Group:  Engaged  Modes of Intervention:  Support  Summary of Progress/Problems:  Carlos NeerJackie L Narissa [name] 03/22/2017, 11:45 PM

## 2017-03-22 NOTE — [facility] Group Notes (Signed)
  03/22/2017  Time: 0930  Type of Therapy/Topic:  Group Therapy:  Emotion Regulation  Participation Level:  Active   Description of Group:    The purpose of this group is to assist patients in learning to regulate negative emotions and experience positive emotions. Patients will be guided to discuss ways in which they have been vulnerable to their negative emotions. These vulnerabilities will be juxtaposed with experiences of positive emotions or situations, and patients will be challenged to use positive emotions to combat negative ones. Special emphasis will be placed on coping with negative emotions in conflict situations, and patients will process healthy conflict resolution skills.  Therapeutic Goals: 1. Patient will identify two positive emotions or experiences to reflect on in order to balance out negative emotions 2. Patient will label two or more emotions that they find the most difficult to experience 3. Patient will demonstrate positive conflict resolution skills through discussion and/or role plays  Summary of Patient Progress: Pt continues to work towards their tx goals but has not yet reached them. Pt was able to appropriately participate in group discussion, and was able to offer support/validation to other group members. Pt reported feeling, "better than I have in a long time. I think it's because they are switching my medications." Pt reported having a difficult time controlling his anger with others. Pt reported two ways he can better control his anger are, "listening to music and being outside."    Therapeutic Modalities:   Cognitive Behavioral Therapy Feelings Identification Dialectical Behavioral Therapy   Heidi DachKelsey Kasey Hansell, MSW, LCSW 03/22/2017 10:19 AM
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
## 2017-03-22 NOTE — B[facility] Group Notes (Signed)
[facility] Group Notes:  (Nursing/MHT/Case Management/Adjunct)  Date:  03/22/2017  Time:  3:25 PM  Type of Therapy:  Psychoeducational Skills  Participation Level:  Active  Participation Quality:  Appropriate and Attentive  Affect:  Appropriate  Cognitive:  Appropriate  Insight:  Appropriate and Good  Engagement in Group:  Engaged  Modes of Intervention:  Discussion and Education  Summary of Progress/Problems:  Carlos [name] 03/22/2017, 3:25 PM 

## 2017-03-22 NOTE — Progress Notes (Signed)
Met with patient this AM prior to lunch.    Educated patient on insulin pen use at home.  Reviewed all steps of insulin pen including attachment of needle, 2-unit air shot, dialing up dose, giving injection, removing needle, disposal of sharps, storage of unused insulin, disposal of insulin etc.  Patient able to provide successful return demonstration.  Reviewed troubleshooting with insulin pen.  Also reviewed Signs/Symptoms of Hypoglycemia with patient and how to treat Hypoglycemia at home.  Have asked RNs caring for patient to please allow patient to give all injections here in hospital as much as possible for practice.  MD to give patient Rxs for insulin pens and insulin pen needles.  RNs have been working with patient on learning how to draw up and give insulin injection with syringe and vial as well.  Discussed with patient that Dr. Bary Leriche may discharge patient on long-acting (basal) insulin once daily and rapid-acting insulin TID with meals.  Discussed the difference between long-acting and rapid-acting insulin, how to take, when to take, etc.  Encouraged pt to make sure he has the right insulin, for the right time, in the right amount prior to injecting.  Encouraged pt to have his discharge instructions with him during all injections to make sure he is giving the correct amount of the correct insulin.    Patient told me he has a follow up appt with the [facility] after d/c and plans to use the pharmacy services at the [facility] Management clinic.      --Will follow patient during hospitalization--  Wyn Quaker RN, MSN, CDE Diabetes Coordinator Inpatient Glycemic Control Team Team Pager: [phone] (8a-5p)

## 2017-03-22 NOTE — Discharge Instructions (Signed)
Fingerstick glucose (sugar) goals for home: Before meals: 80-130 mg/dl 2-Hours after meals: less than 180 mg/dl Hemoglobin [id number] goal: 7% or less   Low Blood Sugar Treatment If blood sugar drops near or below 70 mg/dl on blood sugar meter and you have symptoms of low blood sugar (silly, sweaty, shaky): Drink 4 ounces (1/2 cup) of fruit juice of regular (non-diet) soda Recheck blood sugar within 15 minutes of treatment   Insulin Pen Instructions:  1. Remove Insulin pen cap and clean pen 1st with alcohol rub and then clean skin 2nd with alcohol rub 2. Twist insulin pen needle onto pen (right tighty) 3. Remove outer cap and inner cap from needle 4. Dial pen to 2 units and perform prime- press pen to zero and make sure liquid (insulin) comes out of the needle 5. Dial pen to your dose and perform injection into your abdomen 6. Hold needle in skin for 10 seconds after injection 7. Remove needle from insulin pen and discard 8. Place cap back on insulin pen and store safely (at room temperature) 9. Store unused pens in refrigerator and can keep opened insulin pen at room temperature (discard used pen after 30 days)

## 2017-03-22 NOTE — Progress Notes (Addendum)
Inpatient Diabetes Program Recommendations  AACE/ADA: New Consensus Statement on Inpatient Glycemic Control (2015)  Target Ranges:  Prepandial:   less than 140 mg/dL      Peak postprandial:   less than 180 mg/dL (1-2 hours)      Critically ill patients:  140 - 180 mg/dL   Results for Carlos [name], Carlos DAVID "[name]" (MRN [id number]) as of 03/22/2017 08:48  Ref. Range 03/21/2017 07:05 03/21/2017 11:34 03/21/2017 16:34 03/21/2017 20:09  Glucose-Capillary Latest Ref Range: 65 - 99 mg/dL 409245 (H) 811244 (H) 914232 (H) 263 (H)   Results for Carlos [name], Carlos DAVID "[name]" (MRN [id number]) as of 03/22/2017 08:48  Ref. Range 03/22/2017 07:12  Glucose-Capillary Latest Ref Range: 65 - 99 mg/dL 086191 (H)    Home DM Meds: None  Current Insulin Orders: Lantus 25 units daily      Novolog Resistant Correction Scale/ SSI (0-20 units) TID AC + HS      Metformin 1000 mg daily     Plan to see patient this AM prior to discharge to discuss insulin for home and to review use of insulin pen and insulin vial.    MD- In preparation for discharge, recommend the following for use at home until patient can follow up with MD at the [facility] for further management of his diabetes:  Recommend:  1. Metformin 1000 mg daily  2. Lantus 30 units daily (0.25 units/kg dosing)  3. Novolog Moderate SSI (0-15 units) TID before meals: Less than 70 mg/dl- Drink 4 ounces juice & Recheck CBG in 15 minutes 70-120 mg/dl- 0 units 578-469121-150 mg/dl- 2 units 629-528151-200 mg/dl- 3 units 413-244201-250 mg/dl- 5 units 010-272251-300 mg/dl- 8 units 536-644301-350 mg/dl- 11 units 034-742351-400 mg/dl- 15 units  Please give Rxs for the following:  1. Toujeo Max Solostar Insulin pen- Order # [id number]  2. Novolog Vial- Order # [id number]  3. Insulin Pen Needles- Order # [id number]  4. Insulin Syringes 0.5 ml- Order # [id number]  [facility] only has insulin pen for the long-acting insulin.  We will have to use vial and syringe for the rapid-acting insulin.  Hopefully  as the patient makes dietary and other lifestyle changes at home, he may be able to come off the Novolog insulin and manage with Metformin and Toujeo insulin alone.    --Will follow patient during hospitalization--  Ambrose FinlandJeannine Johnston Clatie Kessen RN, MSN, CDE Diabetes Coordinator Inpatient Glycemic Control Team Team Pager: [phone] (8a-5p),

## 2017-03-23 LAB — GLUCOSE, CAPILLARY
GLUCOSE-CAPILLARY: 148 mg/dL — AB (ref 65–99)
Glucose-Capillary: 136 mg/dL — ABNORMAL HIGH (ref 65–99)

## 2017-03-23 LAB — VALPROIC ACID LEVEL: VALPROIC ACID LVL: 108 ug/mL — AB (ref 50.0–100.0)

## 2017-03-23 MED ORDER — INSULIN ASPART 100 UNIT/ML ~~LOC~~ SOLN: SUBCUTANEOUS | 3 refills | Status: DC

## 2017-03-23 MED ORDER — DIVALPROEX SODIUM 500 MG PO DR TAB
500.0000 mg | DELAYED_RELEASE_TABLET | Freq: Two times a day (BID) | ORAL | Status: DC
Start: 2017-03-23 — End: 2017-03-23

## 2017-03-23 MED ORDER — INSULIN PEN NEEDLE 32G X 5 MM MISC: 1.0000 | Freq: Three times a day (TID) | 1 refills | Status: DC

## 2017-03-23 MED ORDER — INSULIN ASPART 100 UNIT/ML ~~LOC~~ SOLN: 0.0000 [IU] | Freq: Three times a day (TID) | SUBCUTANEOUS | 11 refills | Status: DC

## 2017-03-23 MED ORDER — "INSULIN SYRINGE 27G X 1/2"" 0.5 ML MISC": 1.0000 | Freq: Three times a day (TID) | 1 refills | Status: DC

## 2017-03-23 MED ORDER — LISINOPRIL 20 MG PO TABS: 20.0000 mg | ORAL_TABLET | Freq: Every day | ORAL | 1 refills | Status: DC

## 2017-03-23 MED ORDER — DIVALPROEX SODIUM 500 MG PO DR TAB: 500.0000 mg | DELAYED_RELEASE_TABLET | Freq: Two times a day (BID) | ORAL | 1 refills | Status: DC

## 2017-03-23 MED ORDER — AMLODIPINE BESYLATE 10 MG PO TABS: 10.0000 mg | ORAL_TABLET | Freq: Every day | ORAL | 1 refills | Status: DC

## 2017-03-23 MED ORDER — INSULIN GLARGINE 300 UNIT/ML ~~LOC~~ SOPN: 30.0000 [IU] | PEN_INJECTOR | Freq: Every morning | SUBCUTANEOUS | 1 refills | Status: DC

## 2017-03-23 NOTE — BHH Group Notes (Signed)
  03/23/2017  Time: 0900  Type of Therapy and Topic:  Group Therapy:  Setting Goals Participation Level:  Active  Description of Group: In this process group, patients discussed using strengths to work toward goals and address challenges.  Patients identified two positive things about themselves and one goal they were working on.  Patients were given the opportunity to share openly and support each other's plan for self-empowerment.  The group discussed the value of gratitude and were encouraged to have a daily reflection of positive characteristics or circumstances.  Patients were encouraged to identify a plan to utilize their strengths to work on current challenges and goals.  Therapeutic Goals 1. Patient will verbalize personal strengths/positive qualities and relate how these can assist with achieving desired personal goals 2. Patients will verbalize affirmation of peers plans for personal change and goal setting 3. Patients will explore the value of gratitude and positive focus as related to successful achievement of goals 4. Patients will verbalize a plan for regular reinforcement of personal positive qualities and circumstances.  Summary of Patient Progress: Pt continues to work towards their tx goals but has not yet reached them. Pt was able to appropriately participate in group discussion, and was able to offer support/validation to other group members. Pt reported his goal for the day is to, "manage my blood sugar and (when discharged) go to the gym at least twice a week."    Therapeutic Modalities Cognitive Behavioral Therapy Motivational Interviewing  Heidi DachKelsey Annya Lizana, MSW, LCSW 03/23/2017 9:37 AM

## 2017-03-23 NOTE — Progress Notes (Signed)
Patient is scheduled to be discharged today, denies SI/HI/AVH and pain at this time. Up as lib with a steady gait, affect pleasant and cooperative. Attends group and actively participates. Milieu remains safe. Will be discharged home with a 7 day supply of his medications, Rx's and discharge paperwork.

## 2017-03-23 NOTE — Progress Notes (Signed)
Patient ID: Carlos [name], male   DOB: February 10, 1986, 32 y.o.   MRN: [id number] CSW [facility].

## 2017-03-23 NOTE — Progress Notes (Addendum)
Inpatient Diabetes Program Recommendations  AACE/ADA: New Consensus Statement on Inpatient Glycemic Control (2015)  Target Ranges:  Prepandial:   less than 140 mg/dL      Peak postprandial:   less than 180 mg/dL (1-2 hours)      Critically ill patients:  140 - 180 mg/dL   Lab Results  Component Value Date   GLUCAP 136 (H) 03/23/2017   HGBA1C 12.7 (H) 03/20/2017    Review of Glycemic Control  Results for [name], Carlos DAVID "[name]" (MRN [id number]) as of 03/23/2017 08:01  Ref. Range 03/22/2017 07:12 03/22/2017 11:23 03/22/2017 16:25 03/22/2017 21:04 03/23/2017 06:58  Glucose-Capillary Latest Ref Range: 65 - 99 mg/dL 147191 (H) 829165 (H) 562134 (H) 121 (H) 136 (H)   Home DM Meds: None  Current Insulin Orders: Lantus 30 units daily                                       Novolog Resistant Correction Scale/ SSI (0-20 units) TID AC + HS                                       Metformin 1000 mg daily   CBG ideal this am- received 30 units of Lantus yesterday am and mealtime Novolog as ordered. Patient is ready for discharge in regards to his diabetes management.     MD- In preparation for discharge, recommend the following for use at home until patient can follow up with MD at the [facility] for further management of his diabetes:  Recommend:  1. Metformin 1000 mg daily  2. Lantus 30 units daily (0.25 units/kg dosing)  3. Novolog Moderate SSI (0-15 units) TID before meals: Less than 70 mg/dl- Drink 4 ounces juice & Recheck CBG in 15 minutes 70-120 mg/dl- 0 units 130-865121-150 mg/dl- 2 units 784-696151-200 mg/dl- 3 units 295-284201-250 mg/dl- 5 units 132-440251-300 mg/dl- 8 units 102-725301-350 mg/dl- 11 units 366-440351-400 mg/dl- 15 units  Please give Rxs for the following:  1. Toujeo Max Solostar Insulin pen- Order # [id number]  2. Novolog Vial- Order # [id number]  3. Insulin Pen Needles- Order # [id number]  4. Insulin Syringes 0.5 ml- Order # [id number]  [facility] only has insulin pen for the  long-acting insulin.  We will have to use vial and syringe for the rapid-acting insulin.  Hopefully as the patient makes dietary and other lifestyle changes at home, he may be able to come off the Novolog insulin and manage with Metformin and Toujeo insulin alone.   Carlos RacerJulie Cassiopeia Florentino, RN, BA, MHA, CDE Diabetes Coordinator Inpatient Diabetes Program  [phone] (Team Pager) [phone] [facility] Office) 03/23/2017 8:04 AM

## 2017-03-23 NOTE — Progress Notes (Signed)
  [facility] :  Will you be returning to the same living situation after discharge:  Yes,  Pt will be discharging home with his family At discharge, do you have transportation home?: Yes,  Pt will be picked up by mother and wife Do you have the ability to pay for your medications: Yes,  Pt will receive assistance for paying for his medications  Release of information consent forms completed and in the chart;  Patient's signature needed at discharge.  Patient to Follow up at: Follow-up Information    [facility]. Schedule an appointment as soon as possible for a visit.   Specialty:  Nutrition & Diabetics Contact information: [id number] [place] [id number] [phone]       [facility] Go on 03/24/2017.   Why:  7:15am, for Hospital follow up and  Peer Support Services.  Please keep in touch with Mr. Unk PintoHarvey Bryant at [phone] Contact information: [address] [place] [address] [phone]        [facility] Follow up on 03/30/2017.   Specialty:  Primary Care Why:  Appointment scheduled for 03/30/17 at 5:45 PM. Contact information: [address] [place] [id number] [phone]          Next level of care provider has access to [facility] Link:no  Safety Planning and Suicide Prevention discussed: Yes,  No safety concerns voiced by pt or family members  Have you used any form of tobacco in the last 30 days? (Cigarettes, Smokeless Tobacco, Cigars, and/or Pipes): No  Has patient been referred to the Quitline?: N/A patient is not a smoker  Patient has been referred for addiction treatment: N/A  Alease FrameSonya S Martrice Apt, LCSW 03/23/2017, 4:29 PM

## 2017-03-23 NOTE — Progress Notes (Signed)
Patient ID: Carlos MusicJoshua David [name], male   DOB: 06/24/1985, 32 y.o.   MRN: [id number] Pleasant, polite, visible, attended the Wrap-Up Group. CBG @ bedtime = 121, no coverage provided; mood and affect appropriate; denies SI/HI/AVH. Possible discharge to home and f/u with [facility] per Chart review.

## 2017-03-23 NOTE — Progress Notes (Signed)
Recreation Therapy Notes  INPATIENT RECREATION TR PLAN  Patient Details Name: Carlos [name] MRN: [id number] DOB: 1985-07-06 Today's Date: 03/23/2017  Rec Therapy Plan Is patient appropriate for Therapeutic Recreation?: Yes Treatment times per week: at least 3 Estimated Length of Stay: 5-7 days TR Treatment/Interventions: Group participation (Comment)  Discharge Criteria Pt will be discharged from therapy if:: Discharged Treatment plan/goals/alternatives discussed and agreed upon by:: Patient/family  Discharge Summary Short term goals set: Patient will identify 3 triggers for depressive symptoms x5 days.  Short term goals met: Complete Progress toward goals comments: Groups attended Which groups?: Stress management(Problem Solving, Creative expressions, team work) Reason goals not met: N/A Therapeutic equipment acquired: N/A Reason patient discharged from therapy: Discharge from hospital Pt/family agrees with progress & goals achieved: Yes Date patient discharged from therapy: 03/23/17   [name] 03/23/2017, 1:58 PM

## 2017-03-23 NOTE — BHH Suicide Risk Assessment (Signed)
[facility] Discharge Suicide Risk Assessment   Principal Problem: Bipolar 2 disorder, major depressive episode [facility] Discharge Diagnoses:  Patient Active Problem List   Diagnosis Date Noted  . Bipolar 2 disorder, major depressive episode (HCC) [F31.81] 03/19/2017    Priority: High  . Cannabis use disorder, moderate, dependence (HCC) [F12.20]   . Chronic posttraumatic stress disorder [F43.12]   . Generalized abdominal pain [R10.84] 01/31/2017  . Depression [F32.9] 01/31/2017  . Stage 3 chronic kidney disease (HCC) [N18.3] 06/15/2016  . H/O medication noncompliance [Z91.14] 06/07/2016  . Obesity [E66.9] 06/07/2016  . Erectile dysfunction [N52.9] 06/07/2016  . Type 2 diabetes mellitus with complication (HCC) [E11.8] 06/07/2016  . Hypertensive urgency [I16.0] 06/06/2016  . Chest pain [R07.9] 05/23/2013  . SOB (shortness of breath) [R06.02] 05/23/2013  . Essential hypertension [I10]     Total Time spent with patient: 30 minutes  Musculoskeletal: Strength & Muscle Tone: within normal limits Gait & Station: normal Patient leans: N/A  Psychiatric Specialty Exam: Review of Systems  Neurological: Negative.   Psychiatric/Behavioral: Positive for substance abuse.  All other systems reviewed and are negative.   Blood pressure 132/84, pulse 78, temperature 98 F (36.7 C), temperature source Oral, resp. rate 18, height 5\' 6"  (1.676 m), weight 127 kg (280 lb), SpO2 100 %.Body mass index is 45.19 kg/m.  General Appearance: Casual  Eye Contact::  Good  Speech:  Clear and Coherent409  Volume:  Normal  Mood:  Euthymic  Affect:  Appropriate  Thought Process:  Goal Directed and Descriptions of Associations: Intact  Orientation:  Full (Time, Place, and Person)  Thought Content:  WDL  Suicidal Thoughts:  No  Homicidal Thoughts:  No  Memory:  Immediate;   Fair Recent;   Fair Remote;   Fair  Judgement:  Impaired  Insight:  Shallow  Psychomotor Activity:  Normal  Concentration:  Fair  Recall:   [employer] of Knowledge:Fair  Language: Fair  Akathisia:  No  Handed:  Right  AIMS (if indicated):     Assets:  Communication Skills Desire for Improvement Housing Resilience Social Support  Sleep:  Number of Hours: 6.45  Cognition: WNL  ADL's:  Intact   Mental Status Per Nursing Assessment::   On Admission:  NA  Demographic Factors:  Male, Caucasian and Low socioeconomic status  Loss Factors: Decrease in vocational status, Decline in physical health and Financial problems/change in socioeconomic status  Historical Factors: Prior suicide attempts, Family history of mental illness or substance abuse, Anniversary of important loss and Impulsivity  Risk Reduction Factors:   Sense of responsibility to family, Employed, Living with another person, especially a relative and Positive social support  Continued Clinical Symptoms:  Bipolar Disorder:   Bipolar II Depression:   Comorbid alcohol abuse/dependence Impulsivity Alcohol/Substance Abuse/Dependencies Medical Diagnoses and Treatments/Surgeries  Cognitive Features That Contribute To Risk:  None    Suicide Risk:  Minimal: No identifiable suicidal ideation.  Patients presenting with no risk factors but with morbid ruminations; may be classified as minimal risk based on the severity of the depressive symptoms  Follow-up Information    [facility] [place]. Schedule an appointment as soon as possible for a visit.   Specialty:  Nutrition & Diabetics Contact information: [id number] [place] [address] [phone]       [facility] Go on 03/24/2017.   Why:  7:15am, for Hospital follow up and  Peer Support Services.  Please keep in touch with Mr. Unk Pinto at  [phone] Contact information: [address] [place] [address] [phone]        [facility] Follow up on 03/30/2017.   Specialty:  Primary Care Why:   Appointment scheduled for 03/30/17 at 5:45 PM. Contact information: [address] [place] [id number] [phone]          Plan Of Care/Follow-up recommendations:  Activity:  as tolerated Diet:  low sodium heart healthy ADA diet Other:  keep follow up appointments  Kristine LineaJolanta Donis Pinder, MD 03/23/2017, 10:26 AM

## 2017-03-23 NOTE — BHH Group Notes (Signed)
03/23/2017 9:30AM  Type of Therapy/Topic:  Group Therapy:  Balance in Life  Participation Level:  Active  Description of Group:   This group will address the concept of balance and how it feels and looks when one is unbalanced. Patients will be encouraged to process areas in their lives that are out of balance and identify reasons for remaining unbalanced. Facilitators will guide patients in utilizing problem-solving interventions to address and correct the stressor making their life unbalanced. Understanding and applying boundaries will be explored and addressed for obtaining and maintaining a balanced life. Patients will be encouraged to explore ways to assertively make their unbalanced needs known to significant others in their lives, using other group members and facilitator for support and feedback.  Therapeutic Goals: 1. Patient will identify two or more emotions or situations they have that consume much of in their lives. 2. Patient will identify signs/triggers that life has become out of balance:  3. Patient will identify two ways to set boundaries in order to achieve balance in their lives:  4. Patient will demonstrate ability to communicate their needs through discussion and/or role plays  Summary of Patient Progress: Actively and appropriately engaged in the group. Patient was able to provide support and validation to other group members.Patient practiced active listening when interacting with the facilitator and other group members Patient in still in the process of obtaining treatment goals. Carlos [name] reports having healthy boundaries because he knows when to be open and when to be rigid. He also spoke about a movie that related to the group topic to help others understand.        Therapeutic Modalities:   Cognitive Behavioral Therapy Solution-Focused Therapy Assertiveness Training  Carlos [name]  Carlos [name], [place]

## 2017-03-23 NOTE — Plan of Care (Signed)
Patient slept for Estimated Hours of 6.45; Precautionary checks every 15 minutes for safety maintained, room free of safety hazards, patient sustains no injury or falls during this shift.  

## 2017-03-23 NOTE — Progress Notes (Signed)
Recreation Therapy Notes   Date: 01.10.2019  Time: 1:00PM  Location: Craft Room  Behavioral response: Appropriate  Intervention Topic: Stress  Discussion/Intervention: Group content on today was focused on stress. The group defined stress and way to cope with stress. Participants expressed how they know when they are stresses out. Individuals described the different ways they have to cope with stress. The group stated reasons why it is important to cope with stress. Patient explained what good stress is and some examples. The group participated in the intervention "Dealing with stress". Individuals had a chance to discuss positive and negative ways to deal with certain stressful situations.  Clinical Observations/Feedback:  Patient came to group and stated a way he deals with stress is to listen to music and smoke pot. He participated in the intervention and was social with peers and staff during group.  Prabhjot Piscitello LRT/CTRS            Mavin Dyke 03/23/2017 1:51 PM
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
## 2017-03-23 NOTE — Progress Notes (Signed)
Patient discharged on above date and time. Picked up by mother and wife to go home. Patient has plans to follow up with [facility] 03/24/17. Seven day supply , Rx's, discharge paper work and personal items in hand upon departure from the unit. No complaints voiced, A&O x 4 with steady gait.

## 2017-03-30 ENCOUNTER — Encounter: Payer: Self-pay | Admitting: Licensed Clinical Social Worker

## 2017-03-30 ENCOUNTER — Ambulatory Visit: Payer: Medicaid Other | Admitting: Family Medicine

## 2017-03-30 VITALS — BP 152/99 | HR 88 | Wt 276.0 lb

## 2017-03-30 DIAGNOSIS — E1159 Type 2 diabetes mellitus with other circulatory complications: Secondary | ICD-10-CM

## 2017-03-30 DIAGNOSIS — G589 Mononeuropathy, unspecified: Secondary | ICD-10-CM

## 2017-03-30 DIAGNOSIS — I152 Hypertension secondary to endocrine disorders: Secondary | ICD-10-CM

## 2017-03-30 DIAGNOSIS — F3181 Bipolar II disorder: Secondary | ICD-10-CM

## 2017-03-30 DIAGNOSIS — E785 Hyperlipidemia, unspecified: Secondary | ICD-10-CM

## 2017-03-30 DIAGNOSIS — E1169 Type 2 diabetes mellitus with other specified complication: Secondary | ICD-10-CM | POA: Insufficient documentation

## 2017-03-30 DIAGNOSIS — F32A Depression, unspecified: Secondary | ICD-10-CM

## 2017-03-30 DIAGNOSIS — Z09 Encounter for follow-up examination after completed treatment for conditions other than malignant neoplasm: Secondary | ICD-10-CM

## 2017-03-30 DIAGNOSIS — Z794 Long term (current) use of insulin: Secondary | ICD-10-CM

## 2017-03-30 DIAGNOSIS — F329 Major depressive disorder, single episode, unspecified: Secondary | ICD-10-CM

## 2017-03-30 DIAGNOSIS — E118 Type 2 diabetes mellitus with unspecified complications: Secondary | ICD-10-CM

## 2017-03-30 DIAGNOSIS — F411 Generalized anxiety disorder: Secondary | ICD-10-CM

## 2017-03-30 DIAGNOSIS — I10 Essential (primary) hypertension: Secondary | ICD-10-CM

## 2017-03-30 DIAGNOSIS — R21 Rash and other nonspecific skin eruption: Secondary | ICD-10-CM

## 2017-03-30 MED ORDER — HYDROCORTISONE 1 % EX CREA: 1.0000 "application " | TOPICAL_CREAM | Freq: Two times a day (BID) | CUTANEOUS | 0 refills | Status: DC

## 2017-03-30 MED ORDER — GABAPENTIN 100 MG PO CAPS: 100.0000 mg | ORAL_CAPSULE | Freq: Three times a day (TID) | ORAL | 0 refills | Status: DC

## 2017-03-30 NOTE — Progress Notes (Signed)
Patient: Carlos MusicJoshua David Sherrod Male    DOB: 1986-02-19   32 y.o.   MRN: [id number] Visit Date: 03/30/2017  Today's Provider: Kallie LocksNatalie M Tiffine Henigan, FNP   Chief Complaint  Patient presents with  . Establish Care  . Rash    red bumps on arms  . Pain    in feet   Subjective:   HPI  Follow up post hospital visit to establish PCP.    No Known Allergies Previous Medications   AMLODIPINE (NORVASC) 10 MG TABLET    Take 1 tablet (10 mg total) by mouth daily.   BUPROPION (WELLBUTRIN XL) 150 MG 24 HR TABLET    Take 1 tablet (150 mg total) by mouth daily.   DIVALPROEX (DEPAKOTE) 500 MG DR TABLET    Take 1 tablet (500 mg total) by mouth every 12 (twelve) hours.   INSULIN ASPART (NOVOLOG) 100 UNIT/ML INJECTION    Use as directed per SSI Novolog Moderate SSI (0-15 units) TID before meals: Less than 70 mg/dl- Drink 4 ounces juice & Recheck CBG in 15 minutes 70-120 mg/dl- 0 units 696-295121-150 mg/dl- 2 units 284-132151-200 mg/dl- 3 units 440-102201-250 mg/dl- 5 units 725-366251-300 mg/dl- 8 units 440-347301-350 mg/dl- 11 units 425-956351-400 mg/dl- 15 units   INSULIN GLARGINE (TOUJEO MAX SOLOSTAR) 300 UNIT/ML SOPN    Inject 30 Units into the skin every morning.   INSULIN PEN NEEDLE 32G X 5 MM MISC    1 each by Does not apply route 3 (three) times daily.   INSULIN SYRINGE 27G X 1/2" 0.5 ML MISC    1 each by Does not apply route 3 (three) times daily.   LISINOPRIL (PRINIVIL,ZESTRIL) 20 MG TABLET    Take 1 tablet (20 mg total) by mouth daily.   METFORMIN (GLUCOPHAGE) 1000 MG TABLET    Take 1 tablet (1,000 mg total) by mouth daily with breakfast.   TRAZODONE (DESYREL) 100 MG TABLET    Take 1 tablet (100 mg total) by mouth at bedtime.    Review of Systems  Neurological: Positive for numbness.       Peripheral neuropathy in feet.   All other systems reviewed and are negative.   Social History   Tobacco Use  . Smoking status: Former Smoker    Packs/day: 0.50    Years: 1.00    Pack years: 0.50    Types: Cigars    Last attempt to  quit: 2016    Years since quitting: 3.0  . Smokeless tobacco: Never Used  Substance Use Topics  . Alcohol use: No    Comment: occasional   Objective:   BP (!) 152/99 (BP Location: Left Arm, Patient Position: Sitting, Cuff Size: Normal)   Pulse 88   Wt 276 lb (125.2 kg)   BMI 44.55 kg/m   Physical Exam     Assessment & Plan:   1. Hospital discharge follow-up Patient to follow up post hospital discharge.  - CBC; Future - Comprehensive metabolic panel; Future - TSH - Comprehensive metabolic panel - CBC  2. Hypertension associated with diabetes (HCC) BP is 155/99 today. Continue BP meds. Monitor.  - CB/C; Future - Comprehensive metabolic panel; Future - Lipid Profile - Comprehensive metabolic panel - CBC  3. Type 2 diabetes mellitus with complication, with long-term current use of insulin (HCC) Hgb A1c was 12.7. Continue diabetic medications.  - CBC; Future - Comprehensive metabolic panel; Future - Lipid Profile - Comprehensive metabolic panel - CBC  4. Depression, unspecified depression type Continue anti-anxiety medications as directed.  -  CBC; Future - Comprehensive metabolic panel; Future - Lipid Profile - Comprehensive metabolic panel - CBC  5. Mononeuropathy Peripheral neuropathy in feet. Rx for Neurontin to pharmacy.   6. Skin rash Rx for Hydrocortisone cream to pharmacy  7. Hyperlipidemia associated with type 2 diabetes mellitus (HCC) Continue Lipitor as directed.  - Lipid Profile  8. Hypertension, unspecified type See # 2.  - Basic metabolic panel  9. Follow up Follow up in 2 weeks for OV and lab review.    Kallie Locks, FNP   [facility]
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
## 2017-03-31 LAB — COMPREHENSIVE METABOLIC PANEL
ALT: 23 IU/L (ref 0–44)
AST: 16 IU/L (ref 0–40)
Albumin/Globulin Ratio: 1.7 (ref 1.2–2.2)
Albumin: 4.4 g/dL (ref 3.5–5.5)
Alkaline Phosphatase: 83 IU/L (ref 39–117)
BUN/Creatinine Ratio: 8 — ABNORMAL LOW (ref 9–20)
BUN: 11 mg/dL (ref 6–20)
Bilirubin Total: 0.2 mg/dL (ref 0.0–1.2)
CO2: 22 mmol/L (ref 20–29)
Calcium: 9.5 mg/dL (ref 8.7–10.2)
Chloride: 101 mmol/L (ref 96–106)
Creatinine, Ser: 1.38 mg/dL — ABNORMAL HIGH (ref 0.76–1.27)
GFR calc Af Amer: 79 mL/min/{1.73_m2} (ref >59)
GFR calc non Af Amer: 69 mL/min/{1.73_m2} (ref >59)
Globulin, Total: 2.6 g/dL (ref 1.5–4.5)
Glucose: 98 mg/dL (ref 65–99)
Potassium: 4.5 mmol/L (ref 3.5–5.2)
Sodium: 141 mmol/L (ref 134–144)
Total Protein: 7 g/dL (ref 6.0–8.5)

## 2017-03-31 LAB — CBC
Hematocrit: 37.7 % (ref 37.5–51.0)
Hemoglobin: 12.5 g/dL — ABNORMAL LOW (ref 13.0–17.7)
MCH: 29.3 pg (ref 26.6–33.0)
MCHC: 33.2 g/dL (ref 31.5–35.7)
MCV: 88 fL (ref 79–97)
Platelets: 332 10*3/uL (ref 150–379)
RBC: 4.27 x10E6/uL (ref 4.14–5.80)
RDW: 13.3 % (ref 12.3–15.4)
WBC: 8.7 10*3/uL (ref 3.4–10.8)

## 2017-03-31 LAB — LIPID PANEL
Chol/HDL Ratio: 3.5 ratio (ref 0.0–5.0)
Cholesterol, Total: 127 mg/dL (ref 100–199)
HDL: 36 mg/dL — ABNORMAL LOW (ref >39)
LDL Calculated: 63 mg/dL (ref 0–99)
Triglycerides: 142 mg/dL (ref 0–149)
VLDL Cholesterol Cal: 28 mg/dL (ref 5–40)

## 2017-03-31 LAB — TSH: TSH: 2.39 u[IU]/mL (ref 0.450–4.500)

## 2017-04-06 ENCOUNTER — Ambulatory Visit: Payer: Self-pay | Admitting: Licensed Clinical Social Worker

## 2017-04-06 DIAGNOSIS — F3181 Bipolar II disorder: Secondary | ICD-10-CM

## 2017-04-06 DIAGNOSIS — F4310 Post-traumatic stress disorder, unspecified: Secondary | ICD-10-CM

## 2017-04-06 DIAGNOSIS — F411 Generalized anxiety disorder: Secondary | ICD-10-CM
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
## 2017-04-13 ENCOUNTER — Ambulatory Visit: Payer: Medicaid Other | Admitting: Adult Health Nurse Practitioner

## 2017-04-13 ENCOUNTER — Ambulatory Visit: Payer: Medicaid Other | Admitting: Licensed Clinical Social Worker

## 2017-04-13 VITALS — BP 160/91 | HR 75 | Temp 98.6°F | Wt 271.4 lb

## 2017-04-13 DIAGNOSIS — F411 Generalized anxiety disorder: Secondary | ICD-10-CM

## 2017-04-13 DIAGNOSIS — F3181 Bipolar II disorder: Secondary | ICD-10-CM

## 2017-04-13 DIAGNOSIS — I10 Essential (primary) hypertension: Secondary | ICD-10-CM

## 2017-04-13 MED ORDER — INSULIN PEN NEEDLE 32G X 5 MM MISC: 1.0000 | Freq: Three times a day (TID) | 1 refills | Status: DC

## 2017-04-13 MED ORDER — "INSULIN SYRINGE 27G X 1/2"" 0.5 ML MISC": 1.0000 | Freq: Three times a day (TID) | 1 refills | Status: DC

## 2017-04-13 MED ORDER — LISINOPRIL 40 MG PO TABS: 40.0000 mg | ORAL_TABLET | Freq: Every day | ORAL | 4 refills | Status: DC

## 2017-04-13 NOTE — Progress Notes (Signed)
NEEDS REFILLS ON INSULIN PEN NEEDLES AND INSULIN SYRINGE

## 2017-04-13 NOTE — Progress Notes (Signed)
Subjective:    Patient ID: Carlos [name], male    DOB: Dec 27, 1985, 32 y.o.   MRN: [id number]  HPI   Carlos [name] is a 32 yo male here for lab review and med refill.  Pt reports he hasn't taken his BP med yet today - will take it after visit.  Pt denies bleeding.  Pt reports he has erectile dysfunction and no climax for "awhile" - reports before started on meds.   Patient Active Problem List   Diagnosis Date Noted  . Hyperlipidemia associated with type 2 diabetes mellitus (HCC) 03/30/2017  . Bipolar 2 disorder, major depressive episode (HCC) 03/19/2017  . Cannabis use disorder, moderate, dependence (HCC)   . Chronic posttraumatic stress disorder   . Generalized abdominal pain 01/31/2017  . Depression 01/31/2017  . Stage 3 chronic kidney disease (HCC) 06/15/2016  . H/O medication noncompliance 06/07/2016  . Obesity 06/07/2016  . Erectile dysfunction 06/07/2016  . Type 2 diabetes mellitus with complication (HCC) 06/07/2016  . Hypertensive urgency 06/06/2016  . Chest pain 05/23/2013  . SOB (shortness of breath) 05/23/2013  . Essential hypertension    Allergies as of 04/13/2017   No Known Allergies     Medication List        Accurate as of 04/13/17  6:27 PM. Always use your most recent med list.          amLODipine 10 MG tablet Commonly known as:  NORVASC Take 1 tablet (10 mg total) by mouth daily.   buPROPion 150 MG 24 hr tablet Commonly known as:  WELLBUTRIN XL Take 1 tablet (150 mg total) by mouth daily.   divalproex 500 MG DR tablet Commonly known as:  DEPAKOTE Take 1 tablet (500 mg total) by mouth every 12 (twelve) hours.   gabapentin 100 MG capsule Commonly known as:  NEURONTIN Take 1 capsule (100 mg total) by mouth 3 (three) times daily.   hydrocortisone cream 1 % Apply 1 application topically 2 (two) times daily.   insulin aspart 100 UNIT/ML injection Commonly known as:  NOVOLOG Use as directed per SSI Novolog Moderate SSI (0-15 units) TID  before meals: Less than 70 mg/dl- Drink 4 ounces juice & Recheck CBG in 15 minutes 70-120 mg/dl- 0 units 409-811 mg/dl- 2 units 914-782 mg/dl- 3 units 956-213 mg/dl- 5 units 086-578 mg/dl- 8 units 469-629 mg/dl- 11 units 528-413 mg/dl- 15 units   Insulin Glargine 300 UNIT/ML Sopn Commonly known as:  TOUJEO MAX SOLOSTAR Inject 30 Units into the skin every morning.   Insulin Pen Needle 32G X 5 MM Misc 1 each by Does not apply route 3 (three) times daily.   Insulin Syringe 27G X 1/2" 0.5 ML Misc 1 each by Does not apply route 3 (three) times daily.   lisinopril 20 MG tablet Commonly known as:  PRINIVIL,ZESTRIL Take 1 tablet (20 mg total) by mouth daily.   metFORMIN 1000 MG tablet Commonly known as:  GLUCOPHAGE Take 1 tablet (1,000 mg total) by mouth daily with breakfast.   traZODone 100 MG tablet Commonly known as:  DESYREL Take 1 tablet (100 mg total) by mouth at bedtime.         Review of Systems  Labs reviewed. BP is elevated today. Last visit it was 152/99. Kidney function has improved to 1.38 from 1.59. Diabetes - pt self reports last sugar at 194 w/ fast acting insulin.     Objective:   Physical Exam  Constitutional: He is oriented to person, place, and time.  He appears well-developed and well-nourished.  Cardiovascular: Normal rate, regular rhythm and normal heart sounds.  Pulmonary/Chest: Effort normal and breath sounds normal.  Abdominal: Soft. Bowel sounds are normal.  Neurological: He is alert and oriented to person, place, and time.  Psychiatric: He has a normal mood and affect. His behavior is normal.  Vitals reviewed.   BP (!) 160/91   Pulse 75   Temp 98.6 F (37 C)   Wt 271 lb 6.4 oz (123.1 kg)   BMI 43.81 kg/m        Assessment & Plan:   Increase Lisinopril to 40mg  for elevated BP F/u in 4 weeks for BP check and med evaluation.

## 2017-04-13 NOTE — Progress Notes (Signed)
Subjective:  Patient ID: Carlos [name], male   DOB: 03/21/1985, 32 y.o.   MRN: [id number]    Increase ability to appropriately verbalize feelings and Increase emotional regulation  Carlos [name] presents with depression. Onset of symptoms was several years with gradually improving improving. Symptoms have been occurringNot influenced by the time of the day.  Symptoms are currently rated marked. Associated signs and symptoms include: poor social interaction and sadnessFinancial difficulties Health problems Marital or family conflict.   Carlos [name] reports that he has been having a really difficult time in the last week. He notes that his wife decided that she needed some time to herself for a little while and despite trying to reassure him that it has nothing to do with him that he can't help to balme himself for all the wrong doings he has done. He notes that he is having a difficult time sleeping, is tired all the time, and is having crying spells. He notes that [facility] told him that he cannot be seen for therapy here and see a doctor at their facility. He explains that he was told that he would [facility] therapist [facility] to [facility] medications. He expressed his frustration and anxious feelings over wanting to come here for therapy but still needing to be on his mental health medications. He notes that he is trying to take care of himself but can't help worrying about everything else. He denies suicidal and homicidal thoughts.  Therapeutic Interventions: Cognitive Behavioral therapy and Supportive therapy was utilized by the clinician during today's session. Clinician processed with the patient regarding how things have been going since the last session. Clinician processed with the patient regarding the issues that came up with his wife and how he is feeling. Clinician explained to the patient that it sounds like there are some things that he and his wife need to work on in terms of  communication. Clinician provided the patient with handouts on how to figure out to solve conflicts in the relationship, use I statements without reflecting blame onto his wife, and health relationship activity. Clinician encouraged the patient to not loose hope with the news he received from [facility], had him fill out a release of information, and explained that she would see if she could work something out. Clinician encouraged the patient to practice self care.   Return visit in 1 week.    Effectiveness:  Established problem, stable/improving (1). Progressing It is felt more time is needed for Interventions to work.  . Patient is fully or not fully Other:  orientated to time and place. Patient's Appropriate into problems. Active. Thought process is  Coherent.Minimal: No identifiable suicidal ideation.  Patients presenting with no risk factors but with morbid ruminations; may be classified as minimal risk based on the severity of the depressive symptoms and None.   Homework:Relationship Communication Activity, I statement Handout, practice self care, and conflict resolution hand out  Plan: Follow up with Carlos BullocksHeather Simpson, LCSW at [facility] in one week or earlier if needed.

## 2017-04-18 ENCOUNTER — Ambulatory Visit: Payer: Medicaid Other | Admitting: Gastroenterology

## 2017-04-20 ENCOUNTER — Ambulatory Visit: Payer: Self-pay | Admitting: Licensed Clinical Social Worker

## 2017-04-24 ENCOUNTER — Encounter: Payer: Self-pay | Admitting: Gastroenterology

## 2017-04-24 ENCOUNTER — Encounter (INDEPENDENT_AMBULATORY_CARE_PROVIDER_SITE_OTHER): Payer: Self-pay

## 2017-04-24 ENCOUNTER — Ambulatory Visit (INDEPENDENT_AMBULATORY_CARE_PROVIDER_SITE_OTHER): Payer: Medicaid Other | Admitting: Gastroenterology

## 2017-04-24 ENCOUNTER — Other Ambulatory Visit
Admission: RE | Admit: 2017-04-24 | Discharge: 2017-04-24 | Disposition: A | Discharge status: Home / Self Care | Payer: Medicaid Other | Source: Ambulatory Visit | Attending: Gastroenterology | Admitting: Gastroenterology

## 2017-04-24 VITALS — BP 146/94 | HR 91 | Temp 97.8°F | Ht 67.0 in | Wt 265.4 lb

## 2017-04-24 DIAGNOSIS — K921 Melena: Secondary | ICD-10-CM | POA: Insufficient documentation

## 2017-04-24 DIAGNOSIS — R933 Abnormal findings on diagnostic imaging of other parts of digestive tract: Secondary | ICD-10-CM

## 2017-04-24 LAB — IRON AND TIBC
IRON: 35 ug/dL — AB (ref 45–182)
Saturation Ratios: 10 % — ABNORMAL LOW (ref 17.9–39.5)
TIBC: 354 ug/dL (ref 250–450)
UIBC: 320 ug/dL

## 2017-04-24 LAB — SEDIMENTATION RATE: SED RATE: 33 mm/h — AB (ref 0–15)

## 2017-04-24 LAB — FERRITIN: Ferritin: 89 ng/mL (ref 24–336)

## 2017-04-24 LAB — C-REACTIVE PROTEIN: CRP: 1.3 mg/dL — ABNORMAL HIGH (ref <1.0)

## 2017-04-24 NOTE — Progress Notes (Signed)
Cephas Darby, MD [address]  [place], [place] [address]  Main: [phone]  Fax: [phone]    Gastroenterology Consultation  Referring Provider:     No ref. provider found Primary Care Physician:  Patient, No Pcp Per Primary Gastroenterologist:  Dr. Cephas Darby Reason for Consultation:   Abdominal pain, nausea, vomiting        HPI:   Carlos [name] is a 32 y.o. male referred from ER for consultation & management of acute enteritis. He went to ER on 01/31/17 with 1 day h/o diffuse abdominal pain a/w nausea, and several episodes of non-bloody diarrhea. He had mildly elevated leukocytosis, lipase and LFTs were normal. He had CT which revealed Thickened and inflamed terminal ileum and distal small bowel loops with loop irregularity. Possible mild involvement of the cecum, but the appendix is normal. He is discharged home on cipro and flagyl for 10days, and bentyl. He reports that his symptoms have mostly resolved, continues to have 4-5 loose, non bloody BMs daily. Not a/w urgency, rectal bleeding, abdominal cramps, nausea, vomiting, fever.  He denies eating outside food, antibiotic use, NSAIDs or sick contacts or recent travel.  He denies having similar episodes in the past.  Prior to this episode, patient reported having regular bowel movements without any abdominal pain, cramps.  Has occasional constipation.  He does experience intermittent episodes of significant bloating and nausea.  He has history of metabolic syndrome, nephrolithiasis status post ureteral stent placement, CKD.  He is intentionally trying to lose weight, lost about 80 pounds.  He does have strong family history of metabolic syndrome and heart disease. He denies any GI surgeries  Follow-up visit 04/24/2017: He denies any GI symptoms today. He lost about 15 pounds since 02/2017 by watching his diet. He cut back on fried foods, red meat. He reports feeling better physically. He was recently  admitted last month secondary to depression. He did not undergo labs and stool studies which I ordered during last visit  NSAIDs: None  Antiplts/Anticoagulants/Anti thrombotics: None  GI Procedures: none  He smokes marijuana, denies smoking cigarettes Denies drinking alcohol He denies family history of inflammatory bowel disease or GI malignancy  Past Medical History:  Diagnosis Date  . H/O medication noncompliance   . Hyperlipidemia   . Hypertension   . Kidney stones   . Lung nodule   . Nephrolithiasis   . Obesity   . Type 2 diabetes mellitus with complication ([place]) 9/62/9528    Past Surgical History:  Procedure Laterality Date  . KIDNEY STONE SURGERY    . TONSILLECTOMY      Current Outpatient Medications:  .  amLODipine (NORVASC) 10 MG tablet, Take 1 tablet (10 mg total) by mouth daily., Disp: 30 tablet, Rfl: 1 .  divalproex (DEPAKOTE) 500 MG DR tablet, Take 1 tablet (500 mg total) by mouth every 12 (twelve) hours., Disp: 60 tablet, Rfl: 1 .  gabapentin (NEURONTIN) 100 MG capsule, Take 1 capsule (100 mg total) by mouth 3 (three) times daily., Disp: 90 capsule, Rfl: 0 .  hydrocortisone cream 1 %, Apply 1 application topically 2 (two) times daily., Disp: 30 g, Rfl: 0 .  insulin aspart (NOVOLOG) 100 UNIT/ML injection, Use as directed per SSI Novolog Moderate SSI (0-15 units) TID before meals: Less than 70 mg/dl- Drink 4 ounces juice & Recheck CBG in 15 minutes 70-120 mg/dl- 0 units 121-150 mg/dl- 2 units 151-200 mg/dl- 3 units 201-250 mg/dl- 5 units 251-300 mg/dl- 8 units  301-350 mg/dl- 11 units 351-400 mg/dl- 15 units, Disp: 10 mL, Rfl: 3 .  Insulin Glargine (TOUJEO MAX SOLOSTAR) 300 UNIT/ML SOPN, Inject 30 Units into the skin every morning., Disp: 9 mL, Rfl: 1 .  Insulin Pen Needle 32G X 5 MM MISC, 1 each by Does not apply route 3 (three) times daily., Disp: 100 each, Rfl: 1 .  Insulin Syringe 27G X 1/2" 0.5 ML MISC, 1 each by Does not apply route 3 (three) times daily., Disp:  100 each, Rfl: 1 .  lisinopril (PRINIVIL,ZESTRIL) 40 MG tablet, Take 1 tablet (40 mg total) by mouth daily., Disp: 30 tablet, Rfl: 4 .  metFORMIN (GLUCOPHAGE) 1000 MG tablet, Take 1 tablet (1,000 mg total) by mouth daily with breakfast., Disp: 30 tablet, Rfl: 1   Family History  Problem Relation Age of Onset  . Heart failure Mother   . Hypertension Mother   . Lung cancer Mother   . Brain cancer Father   . Lung cancer Father   . Diabetes Sister   . Autism Brother      Social History   Tobacco Use  . Smoking status: Former Smoker    Packs/day: 0.50    Years: 1.00    Pack years: 0.50    Types: Cigars    Last attempt to quit: 2016    Years since quitting: 3.1  . Smokeless tobacco: Never Used  Substance Use Topics  . Alcohol use: No    Comment: occasional  . Drug use: Yes    Frequency: 7.0 times per week    Types: Marijuana    Allergies as of 04/24/2017  . (No Known Allergies)    Review of Systems:    All systems reviewed and negative except where noted in HPI.   Physical Exam:  BP (!) 146/94   Pulse 91   Temp 97.8 F (36.6 C) (Oral)   Ht 5' 7" (1.702 m)   Wt 265 lb 6.4 oz (120.4 kg)   BMI 41.57 kg/m  No LMP for male patient.  General:   Alert, obese, pleasant and cooperative in NAD Head:  Normocephalic and atraumatic. Eyes:  Sclera clear, no icterus.   Conjunctiva pink. Ears:  Normal auditory acuity. Nose:  No deformity, discharge, or lesions. Mouth:  No deformity or lesions,oropharynx pink & moist. Neck:  Supple; no masses or thyromegaly. Lungs:  Respirations even and unlabored.  Clear throughout to auscultation.   No wheezes, crackles, or rhonchi. No acute distress. Heart:  Regular rate and rhythm; no murmurs, clicks, rubs, or gallops. Abdomen:  Normal bowel sounds. Soft, redundant skin and abdomen, nontender and non-distended without masses, hepatosplenomegaly or hernias noted.  No guarding or rebound tenderness.   Rectal: Nor performed Msk:  Symmetrical  without gross deformities. Good, equal movement & strength bilaterally. Pulses:  Normal pulses noted. Extremities:  No clubbing or edema.  No cyanosis. Neurologic:  Alert and oriented x3;  grossly normal neurologically. Skin:  Intact without significant lesions or rashes. No jaundice, redundant skin in his trunk and upper extremities secondary to significant weight loss Lymph Nodes:  No significant cervical adenopathy. Psych:  Alert and cooperative. Normal mood and affect.  Imaging Studies: Reviewed  Assessment and Plan:   Jenny Omdahl is a 32 y.o. Caucasian male morbid obesity, metabolic syndrome, had acute gastroenteritis which improved with Cipro and Flagyl, CT revealing thickening of the terminal ileum and distal ileum with dilated small bowel loops in 01/2017 here for follow-up. He does not have any  GI symptoms at present  -Check ESR, CRP, ferritin, iron, TIBC -Check fecal calprotectin -Discussed about colonoscopy if above tests come back abnormal or he has recurrence of symptoms -Encouraged him to continue to lose weight by following healthy diet  Follow up in 3 months   Cephas Darby, MD

## 2017-04-24 NOTE — Patient Instructions (Addendum)
Heart-Healthy Eating Plan Many factors influence your heart health, including eating and exercise habits. Heart (coronary) risk increases with abnormal blood fat (lipid) levels. Heart-healthy meal planning includes limiting unhealthy fats, increasing healthy fats, and making other small dietary changes. This includes maintaining a healthy body weight to help keep lipid levels within a normal range. What is my plan? Your health care provider recommends that you:  Get no more than _________% of the total calories in your daily diet from fat.  Limit your intake of saturated fat to less than _________% of your total calories each day.  Limit the amount of cholesterol in your diet to less than _________ mg per day.  What types of fat should I choose?  Choose healthy fats more often. Choose monounsaturated and polyunsaturated fats, such as olive oil and canola oil, flaxseeds, walnuts, almonds, and seeds.  Eat more omega-3 fats. Good choices include salmon, mackerel, sardines, tuna, flaxseed oil, and ground flaxseeds. Aim to eat fish at least two times each week.  Limit saturated fats. Saturated fats are primarily found in animal products, such as meats, butter, and cream. Plant sources of saturated fats include palm oil, palm kernel oil, and coconut oil.  Avoid foods with partially hydrogenated oils in them. These contain trans fats. Examples of foods that contain trans fats are stick margarine, some tub margarines, cookies, crackers, and other baked goods. What general guidelines do I need to follow?  Check food labels carefully to identify foods with trans fats or high amounts of saturated fat.  Fill one half of your plate with vegetables and green salads. Eat 4-5 servings of vegetables per day. A serving of vegetables equals 1 cup of raw leafy vegetables,  cup of raw or cooked cut-up vegetables, or  cup of vegetable juice.  Fill one fourth of your plate with whole grains. Look for the word  "whole" as the first word in the ingredient list.  Fill one fourth of your plate with lean protein foods.  Eat 4-5 servings of fruit per day. A serving of fruit equals one medium whole fruit,  cup of dried fruit,  cup of fresh, frozen, or canned fruit, or  cup of 100% fruit juice.  Eat more foods that contain soluble fiber. Examples of foods that contain this type of fiber are apples, broccoli, carrots, beans, peas, and barley. Aim to get 20-30 g of fiber per day.  Eat more home-cooked food and less restaurant, buffet, and fast food.  Limit or avoid alcohol.  Limit foods that are high in starch and sugar.  Avoid fried foods.  Cook foods by using methods other than frying. Baking, boiling, grilling, and broiling are all great options. Other fat-reducing suggestions include: ? Removing the skin from poultry. ? Removing all visible fats from meats. ? Skimming the fat off of stews, soups, and gravies before serving them. ? Steaming vegetables in water or broth.  Lose weight if you are overweight. Losing just 5-10% of your initial body weight can help your overall health and prevent diseases such as diabetes and heart disease.  Increase your consumption of nuts, legumes, and seeds to 4-5 servings per week. One serving of dried beans or legumes equals  cup after being cooked, one serving of nuts equals 1 ounces, and one serving of seeds equals  ounce or 1 tablespoon.  You may need to monitor your salt (sodium) intake, especially if you have high blood pressure. Talk with your health care provider or dietitian to get  more information about reducing sodium. What foods can I eat? Grains  Breads, including French, white, pita, wheat, raisin, rye, oatmeal, and Italian. Tortillas that are neither fried nor made with lard or trans fat. Low-fat rolls, including hotdog and hamburger buns and English muffins. Biscuits. Muffins. Waffles. Pancakes. Light popcorn. Whole-grain cereals. Flatbread.  Melba toast. Pretzels. Breadsticks. Rusks. Low-fat snacks and crackers, including oyster, saltine, matzo, graham, animal, and rye. Rice and pasta, including brown rice and those that are made with whole wheat. Vegetables All vegetables. Fruits All fruits, but limit coconut. Meats and Other Protein Sources Lean, well-trimmed beef, veal, pork, and lamb. Chicken and turkey without skin. All fish and shellfish. Wild duck, rabbit, pheasant, and venison. Egg whites or low-cholesterol egg substitutes. Dried beans, peas, lentils, and tofu.Seeds and most nuts. Dairy Low-fat or nonfat cheeses, including ricotta, string, and mozzarella. Skim or 1% milk that is liquid, powdered, or evaporated. Buttermilk that is made with low-fat milk. Nonfat or low-fat yogurt. Beverages Mineral water. Diet carbonated beverages. Sweets and Desserts Sherbets and fruit ices. Honey, jam, marmalade, jelly, and syrups. Meringues and gelatins. Pure sugar candy, such as hard candy, jelly beans, gumdrops, mints, marshmallows, and small amounts of dark chocolate. Angel food cake. Eat all sweets and desserts in moderation. Fats and Oils Nonhydrogenated (trans-free) margarines. Vegetable oils, including soybean, sesame, sunflower, olive, peanut, safflower, corn, canola, and cottonseed. Salad dressings or mayonnaise that are made with a vegetable oil. Limit added fats and oils that you use for cooking, baking, salads, and as spreads. Other Cocoa powder. Coffee and tea. All seasonings and condiments. The items listed above may not be a complete list of recommended foods or beverages. Contact your dietitian for more options. What foods are not recommended? Grains Breads that are made with saturated or trans fats, oils, or whole milk. Croissants. Butter rolls. Cheese breads. Sweet rolls. Donuts. Buttered popcorn. Chow mein noodles. High-fat crackers, such as cheese or butter crackers. Meats and Other Protein Sources Fatty meats, such  as hotdogs, short ribs, sausage, spareribs, bacon, ribeye roast or steak, and mutton. High-fat deli meats, such as salami and bologna. Caviar. Domestic duck and goose. Organ meats, such as kidney, liver, sweetbreads, brains, gizzard, chitterlings, and heart. Dairy Cream, sour cream, cream cheese, and creamed cottage cheese. Whole milk cheeses, including blue (bleu), Monterey Jack, Brie, [place], American, Havarti, Swiss, cheddar, Camembert, and [place]. Whole or 2% milk that is liquid, evaporated, or condensed. Whole buttermilk. Cream sauce or high-fat cheese sauce. Yogurt that is made from whole milk. Beverages Regular sodas and drinks with added sugar. Sweets and Desserts Frosting. Pudding. Cookies. Cakes other than angel food cake. Candy that has milk chocolate or white chocolate, hydrogenated fat, butter, coconut, or unknown ingredients. Buttered syrups. Full-fat ice cream or ice cream drinks. Fats and Oils Gravy that has suet, meat fat, or shortening. Cocoa butter, hydrogenated oils, palm oil, coconut oil, palm kernel oil. These can often be found in baked products, candy, fried foods, nondairy creamers, and whipped toppings. Solid fats and shortenings, including bacon fat, salt pork, lard, and butter. Nondairy cream substitutes, such as coffee creamers and sour cream substitutes. Salad dressings that are made of unknown oils, cheese, or sour cream. The items listed above may not be a complete list of foods and beverages to avoid. Contact your dietitian for more information. This information is not intended to replace advice given to you by your health care provider. Make sure you discuss any questions you have with your health care   provider. Document Released: 12/08/2007 Document Revised: 09/18/2015 Document Reviewed: 08/22/2013 Elsevier Interactive Patient Education  2018 [employer].  Low-Sodium Eating Plan Sodium, which is an element that makes up salt, helps you maintain a healthy balance  of fluids in your body. Too much sodium can increase your blood pressure and cause fluid and waste to be held in your body. Your health care provider or dietitian may recommend following this plan if you have high blood pressure (hypertension), kidney disease, liver disease, or heart failure. Eating less sodium can help lower your blood pressure, reduce swelling, and protect your heart, liver, and kidneys. What are tips for following this plan? General guidelines  Most people on this plan should limit their sodium intake to 1,500-2,000 mg (milligrams) of sodium each day. Reading food labels  The Nutrition Facts label lists the amount of sodium in one serving of the food. If you eat more than one serving, you must multiply the listed amount of sodium by the number of servings.  Choose foods with less than 140 mg of sodium per serving.  Avoid foods with 300 mg of sodium or more per serving. Shopping  Look for lower-sodium products, often labeled as "low-sodium" or "no salt added."  Always check the sodium content even if foods are labeled as "unsalted" or "no salt added".  Buy fresh foods. ? Avoid canned foods and premade or frozen meals. ? Avoid canned, cured, or processed meats  Buy breads that have less than 80 mg of sodium per slice. Cooking  Eat more home-cooked food and less restaurant, buffet, and fast food.  Avoid adding salt when cooking. Use salt-free seasonings or herbs instead of table salt or sea salt. Check with your health care provider or pharmacist before using salt substitutes.  Cook with plant-based oils, such as canola, sunflower, or olive oil. Meal planning  When eating at a restaurant, ask that your food be prepared with less salt or no salt, if possible.  Avoid foods that contain MSG (monosodium glutamate). MSG is sometimes added to [place] food, bouillon, and some canned foods. What foods are recommended? The items listed may not be a complete list. Talk  with your dietitian about what dietary choices are best for you. Grains Low-sodium cereals, including oats, puffed wheat and rice, and shredded wheat. Low-sodium crackers. Unsalted rice. Unsalted pasta. Low-sodium bread. Whole-grain breads and whole-grain pasta. Vegetables Fresh or frozen vegetables. "No salt added" canned vegetables. "No salt added" tomato sauce and paste. Low-sodium or reduced-sodium tomato and vegetable juice. Fruits Fresh, frozen, or canned fruit. Fruit juice. Meats and other protein foods Fresh or frozen (no salt added) meat, poultry, seafood, and fish. Low-sodium canned tuna and salmon. Unsalted nuts. Dried peas, beans, and lentils without added salt. Unsalted canned beans. Eggs. Unsalted nut butters. Dairy Milk. Soy milk. Cheese that is naturally low in sodium, such as ricotta cheese, fresh mozzarella, or Swiss cheese Low-sodium or reduced-sodium cheese. Cream cheese. Yogurt. Fats and oils Unsalted butter. Unsalted margarine with no trans fat. Vegetable oils such as canola or olive oils. Seasonings and other foods Fresh and dried herbs and spices. Salt-free seasonings. Low-sodium mustard and ketchup. Sodium-free salad dressing. Sodium-free light mayonnaise. Fresh or refrigerated horseradish. Lemon juice. Vinegar. Homemade, reduced-sodium, or low-sodium soups. Unsalted popcorn and pretzels. Low-salt or salt-free chips. What foods are not recommended? The items listed may not be a complete list. Talk with your dietitian about what dietary choices are best for you. Grains Instant hot cereals. Bread stuffing, pancake, and  biscuit mixes. Croutons. Seasoned rice or pasta mixes. Noodle soup cups. Boxed or frozen macaroni and cheese. Regular salted crackers. Self-rising flour. Vegetables Sauerkraut, pickled vegetables, and relishes. Olives. [place] fries. Onion rings. Regular canned vegetables (not low-sodium or reduced-sodium). Regular canned tomato sauce and paste (not low-sodium  or reduced-sodium). Regular tomato and vegetable juice (not low-sodium or reduced-sodium). Frozen vegetables in sauces. Meats and other protein foods Meat or fish that is salted, canned, smoked, spiced, or pickled. Bacon, ham, sausage, hotdogs, corned beef, chipped beef, packaged lunch meats, salt pork, jerky, pickled herring, anchovies, regular canned tuna, sardines, salted nuts. Dairy Processed cheese and cheese spreads. Cheese curds. Blue cheese. Feta cheese. String cheese. Regular cottage cheese. Buttermilk. Canned milk. Fats and oils Salted butter. Regular margarine. Ghee. Bacon fat. Seasonings and other foods Onion salt, garlic salt, seasoned salt, table salt, and sea salt. Canned and packaged gravies. Worcestershire sauce. Tartar sauce. Barbecue sauce. Teriyaki sauce. Soy sauce, including reduced-sodium. Steak sauce. Fish sauce. Oyster sauce. Cocktail sauce. Horseradish that you find on the shelf. Regular ketchup and mustard. Meat flavorings and tenderizers. Bouillon cubes. Hot sauce and Tabasco sauce. Premade or packaged marinades. Premade or packaged taco seasonings. Relishes. Regular salad dressings. Salsa. Potato and tortilla chips. Corn chips and puffs. Salted popcorn and pretzels. Canned or dried soups. Pizza. Frozen entrees and pot pies. Summary  Eating less sodium can help lower your blood pressure, reduce swelling, and protect your heart, liver, and kidneys.  Most people on this plan should limit their sodium intake to 1,500-2,000 mg (milligrams) of sodium each day.  Canned, boxed, and frozen foods are high in sodium. Restaurant foods, fast foods, and pizza are also very high in sodium. You also get sodium by adding salt to food.  Try to cook at home, eat more fresh fruits and vegetables, and eat less fast food, canned, processed, or prepared foods. This information is not intended to replace advice given to you by your health care provider. Make sure you discuss any questions you  have with your health care provider. Document Released: 08/20/2001 Document Revised: 02/22/2016 Document Reviewed: 02/22/2016 Elsevier Interactive Patient Education  [employer]2018 [employer] Inc[employer]

## 2017-04-25 ENCOUNTER — Ambulatory Visit: Payer: Medicaid Other | Admitting: Licensed Clinical Social Worker

## 2017-04-25 DIAGNOSIS — F3181 Bipolar II disorder: Secondary | ICD-10-CM

## 2017-04-25 DIAGNOSIS — F411 Generalized anxiety disorder: Secondary | ICD-10-CM

## 2017-04-26 NOTE — Progress Notes (Signed)
Subjective:  Patient ID: Carlos MusicJoshua David [name], male   DOB: 01/25/1986, 32 y.o.   MRN: [id number]    Increase emotional regulation  Carlos [name]  presents with depression. Onset of symptoms was several years with gradually improving improving. Symptoms have been occurringNot influenced by the time of the day.  Symptoms are currently rated marked. Associated signs and symptoms include: sadnessMarital or family conflict.   Carlos [name] reports that he has been struggling in the last two weeks. He notes that his wife has come back home but they are not communicating, doesn't know where they are at, and wants them to get on the same page. He notes that they are having issues understanding one another and wanted to know if he could bring his wife to the next session. He explains that she agrees with him that they need someone to help them sort through some issues and get on the same page. He notes that he feels like he is at a point where he can handle his emotions better but feels lost in terms of how to move forward in his marriage. He notes that he had a hard time last week of feeling down and depressed when facebook reminded him of his mom's birthday. He denies suicidal and homicidal thoughts.   Therapeutic Interventions: Cognitive Behavioral therapy was utilized by the clinician during today's session. Clinician processed with the patient regarding how he has been doing since their last session. Clinician processed with the patient regarding how he missed his last appointment due to being sick and that prior to that he was upset due to his wife taking a break from him for a few days. Clinician processed with the patient regarding that she is glad that he wants to bring his wife to a session and is happy to facilitate a mediated session. Clinician suggested that they meet one more time this week to prepare for this upcoming session and what he is comfortable with her sharing with his wife. Clinician explained that  communication is going to be key to moving forward in his relationship. Clinician encouraged the patient to practice self care and they would discuss a game plan on Thursday.   Return visit in 2 days.    Effectiveness:  Established problem, stable/improving (1). Progressing It is felt more time is needed for Interventions to work.  . Patient is fully  Other:  orientated to time and place. Patient's Appropriate into problems. Active. Thought process is  Coherent.Minimal: No identifiable suicidal ideation.  Patients presenting with no risk factors but with morbid ruminations; may be classified as minimal risk based on the severity of the depressive symptoms and None.   Homework: Follow up in two days to discuss recent events and game plan for having wife present at next session.  Plan: Follow up with Carey BullocksHeather Simpson, LCSW at [facility] in two days.

## 2017-04-27 ENCOUNTER — Ambulatory Visit: Payer: Medicaid Other | Admitting: Licensed Clinical Social Worker

## 2017-04-27 DIAGNOSIS — F3181 Bipolar II disorder: Secondary | ICD-10-CM

## 2017-04-27 DIAGNOSIS — F411 Generalized anxiety disorder: Secondary | ICD-10-CM

## 2017-04-27 NOTE — Progress Notes (Signed)
Subjective:  Patient ID: Carlos [name], male   DOB: [birth year], 32 y.o.   MRN: [id number]    Increase emotional regulation and Increase skills for wellness and recovery  Carlos [name]  presents with depression. Onset of symptoms was several years with gradually improving improving. Symptoms have been occurringNot influenced by the time of the day.  Symptoms are currently rated marked. Associated signs and symptoms include: attention difficulties, poor social interaction and sadnessFinancial difficulties Health problems Marital or family conflict Occupational concerns.   Carlos [name] reports that he is a little bit better since the last session. He reports that he is feeling down today because he will be most likely spending Valentine's Day alone. He notes that he did have an interview with [employer] and it went well. He reports that he still wants his wife to attend the next session. He reports that he wants everything to be shared and there is nothing that is off limits. He explains that he wants them to get on the same page regardless if its over or not. He notes that he wishes that she would just talk to him. He explains that had found letters and messages on facebook between her and another guy. He reports that when he confronted her that she tried to tell him it was nothing. He explains that he feels like he may have wasted another three years with someone who he thought he would spend the rest of his life with. He reports that he still has a little bit of hope that he's wrong about everything. He notes that his mood is up and down. He describes feeling angry, sad, having crying, spells, and other times being apathetic. He notes that he has been coping by watching comedies, movies, and his friend spent the night yesterday. He explains that he is trying to stay busy so he is not constantly thinking about the issues. He denies suicidal and homicidal thoughts.  Therapeutic Interventions: Cognitive Behavioral  therapy was utilized by the clinician during today's session. Clinician processed with the patient regarding how he has been doing since their last session. Clinician processed with the patient regarding how he is wanting this next session with his wife to go and want boundaries he would like to set. Clinician asked are their certain things that are off limits during the session. Clinician processed with the patient regarding the events that took place after his wife returned home to cause him to doubt things. Clinician processed with the patient regarding that it's possible that he is wrong but it sounds like his wife never gave him a direct answer. Clinician processed with the patient regarding that she is willing to help mediate a session between him and his wife. Clinician asked the patient how his overall mood has been. Clinician asked the patient if he has been utilizing his coping skills and trying to be more social.   Return visit in 1 week.    Effectiveness:  Established problem, stable/improving (1). Progressing It is felt more time is needed for Interventions to work.  . Patient is fullyOther:  orientated to time and place. Patient's Appropriate into problems. Active. Thought process is  Coherent.Minimal: No identifiable suicidal ideation.  Patients presenting with no risk factors but with morbid ruminations; may be classified as minimal risk based on the severity of the depressive symptoms and None.   Homework: Stay distracted, focus on positives instead of negatives.  Plan: Follow up with Carey BullocksHeather Ayoub Arey, LCSW at [facility] in one week.

## 2017-05-02 ENCOUNTER — Ambulatory Visit: Payer: Medicaid Other | Admitting: Licensed Clinical Social Worker

## 2017-05-02 DIAGNOSIS — F3181 Bipolar II disorder: Secondary | ICD-10-CM

## 2017-05-03 NOTE — Progress Notes (Signed)
Subjective:  Patient ID: Carlos [name], male   DOB: [birth year], 32 y.o.   MRN: [id number]    Increase emotional regulation  [name]  presents with depression. Onset of symptoms was several years with gradually improving improving. Symptoms have been occurringNot influenced by the time of the day.  Symptoms are currently rated marked. Associated signs and symptoms include: anger, sadness and crying spells, low energy, and isolating himself.Marital or family conflict.   [name] reports that he didn't bring his wife because she decided to break up with him through the friend that set them up in the first place. He notes that he has been feeling sad, down, and depressed. He describes having a few crying spells. He notes that he is trying to stay busy and is still waiting to hear from that job at [employer]. He describes feeling like he doesn't think he will get closure any time soon because he is unemployed and is forced to live with his ex until he finds a job. He notes that after that he will have to save money in order to move out. He denies suicidal and homicidal thoughts.   Therapeutic Interventions: Cognitive Behavioral therapy was utilized by the clinician during today's session. Clinician processed with the patient regarding how he has been doing since their last session. Clinician asked where his wife was and thought that they were going to do a couple's counseling session. Clinician explained that it's normal to experience the feelings that he is having and going through a break up is aligned with the stages of grief. Clinician provided psycho education on the five stages of grief: denial, anger, bargaining, depression, and acceptance. Clinician explained that there is no set order for these stages and that it's going to take time for him to heal. Clinician encouraged the patient to focus on the things that he does have in his life and look towards making new goals for the future. Clinician explained  that eventually he will find a job and be able to get up on his own two feet again. Clinician encouraged the patient to focus on gratitude, practice self care, stick to a routine, and stay distracted.   Return visit in 1 week.    Effectiveness:  Established problem, stable/improving (1). Progressing It is felt more time is needed for Interventions to work.  . Patient is fully  Other:  orientated to time and place. Patient's Appropriate into problems. Active. Thought process is  Coherent.Minimal: No identifiable suicidal ideation.  Patients presenting with no risk factors but with morbid ruminations; may be classified as minimal risk based on the severity of the depressive symptoms and None.   Homework: Practice self care, follow routine, and stay distracted.  Plan: Follow up with Carey BullocksHeather Derrian Poli, LCSW at [facility] in one week.

## 2017-05-11 ENCOUNTER — Ambulatory Visit: Payer: Medicaid Other | Admitting: Licensed Clinical Social Worker

## 2017-05-11 ENCOUNTER — Ambulatory Visit: Payer: Medicaid Other | Admitting: Adult Health Nurse Practitioner

## 2017-05-11 ENCOUNTER — Encounter: Payer: Self-pay | Admitting: Emergency Medicine

## 2017-05-11 ENCOUNTER — Emergency Department
Admission: EM | Admit: 2017-05-11 | Discharge: 2017-05-11 | Disposition: A | Discharge status: Home / Self Care | Payer: No Typology Code available for payment source | Attending: Student in an Organized Health Care Education/Training Program | Admitting: Student in an Organized Health Care Education/Training Program

## 2017-05-11 DIAGNOSIS — Y998 Other external cause status: Secondary | ICD-10-CM | POA: Insufficient documentation

## 2017-05-11 DIAGNOSIS — M545 Low back pain: Secondary | ICD-10-CM | POA: Insufficient documentation

## 2017-05-11 DIAGNOSIS — N183 Chronic kidney disease, stage 3 (moderate): Secondary | ICD-10-CM | POA: Insufficient documentation

## 2017-05-11 DIAGNOSIS — F17290 Nicotine dependence, other tobacco product, uncomplicated: Secondary | ICD-10-CM | POA: Insufficient documentation

## 2017-05-11 DIAGNOSIS — Y9389 Activity, other specified: Secondary | ICD-10-CM | POA: Insufficient documentation

## 2017-05-11 DIAGNOSIS — Z794 Long term (current) use of insulin: Secondary | ICD-10-CM | POA: Insufficient documentation

## 2017-05-11 DIAGNOSIS — I129 Hypertensive chronic kidney disease with stage 1 through stage 4 chronic kidney disease, or unspecified chronic kidney disease: Secondary | ICD-10-CM | POA: Diagnosis not present

## 2017-05-11 DIAGNOSIS — E1122 Type 2 diabetes mellitus with diabetic chronic kidney disease: Secondary | ICD-10-CM | POA: Insufficient documentation

## 2017-05-11 DIAGNOSIS — F1210 Cannabis abuse, uncomplicated: Secondary | ICD-10-CM | POA: Diagnosis not present

## 2017-05-11 DIAGNOSIS — Y92410 Unspecified street and highway as the place of occurrence of the external cause: Secondary | ICD-10-CM | POA: Insufficient documentation

## 2017-05-11 DIAGNOSIS — F411 Generalized anxiety disorder: Secondary | ICD-10-CM

## 2017-05-11 DIAGNOSIS — Z79899 Other long term (current) drug therapy: Secondary | ICD-10-CM | POA: Insufficient documentation

## 2017-05-11 DIAGNOSIS — F3181 Bipolar II disorder: Secondary | ICD-10-CM

## 2017-05-11 DIAGNOSIS — I10 Essential (primary) hypertension: Secondary | ICD-10-CM

## 2017-05-11 MED ORDER — KETOROLAC TROMETHAMINE 30 MG/ML IJ SOLN
30.0000 mg | Freq: Once | INTRAMUSCULAR | Status: AC
Start: 2017-05-11 — End: 2017-05-11
  Administered 2017-05-11: 30 mg via INTRAMUSCULAR
  Filled 2017-05-11: qty 1

## 2017-05-11 MED ORDER — MELOXICAM 15 MG PO TABS: 15.0000 mg | ORAL_TABLET | Freq: Every day | ORAL | 1 refills | Status: DC

## 2017-05-11 MED ORDER — GABAPENTIN 100 MG PO CAPS: 100.0000 mg | ORAL_CAPSULE | Freq: Three times a day (TID) | ORAL | 0 refills | Status: DC

## 2017-05-11 MED ORDER — HYDROCORTISONE 1 % EX CREA: 1.0000 "application " | TOPICAL_CREAM | Freq: Two times a day (BID) | CUTANEOUS | 0 refills | Status: DC

## 2017-05-11 MED ORDER — INSULIN GLARGINE 300 UNIT/ML ~~LOC~~ SOPN: 30.0000 [IU] | PEN_INJECTOR | Freq: Every morning | SUBCUTANEOUS | 1 refills | Status: DC

## 2017-05-11 MED ORDER — "INSULIN SYRINGE 27G X 1/2"" 0.5 ML MISC": 1.0000 | Freq: Three times a day (TID) | 1 refills | Status: DC

## 2017-05-11 MED ORDER — AMLODIPINE BESYLATE 10 MG PO TABS: 10.0000 mg | ORAL_TABLET | Freq: Every day | ORAL | 1 refills | Status: DC

## 2017-05-11 MED ORDER — INSULIN PEN NEEDLE 32G X 5 MM MISC: 1.0000 | Freq: Three times a day (TID) | 1 refills | Status: DC

## 2017-05-11 MED ORDER — METHOCARBAMOL 500 MG PO TABS
1000.0000 mg | ORAL_TABLET | Freq: Once | ORAL | Status: AC
  Administered 2017-05-11: 1000 mg via ORAL
  Filled 2017-05-11: qty 2

## 2017-05-11 MED ORDER — INSULIN ASPART 100 UNIT/ML ~~LOC~~ SOLN: SUBCUTANEOUS | 3 refills | Status: DC

## 2017-05-11 MED ORDER — CYCLOBENZAPRINE HCL 10 MG PO TABS: 10.0000 mg | ORAL_TABLET | Freq: Three times a day (TID) | ORAL | 0 refills | Status: AC | PRN

## 2017-05-11 MED ORDER — DIVALPROEX SODIUM 500 MG PO DR TAB: 500.0000 mg | DELAYED_RELEASE_TABLET | Freq: Two times a day (BID) | ORAL | 1 refills | Status: AC

## 2017-05-11 MED ORDER — METFORMIN HCL 1000 MG PO TABS: 1000.0000 mg | ORAL_TABLET | Freq: Every day | ORAL | 1 refills | Status: AC

## 2017-05-11 MED ORDER — LISINOPRIL 40 MG PO TABS: 40.0000 mg | ORAL_TABLET | Freq: Every day | ORAL | 4 refills | Status: AC
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
## 2017-05-11 NOTE — Progress Notes (Signed)
Subjective:  Patient ID: Carlos MusicJoshua David [name], male   DOB: 03-18-1985, 32 y.o.   MRN: [id number]    Increase emotional regulation  [name]  presents with depression. Onset of symptoms was several years with gradually improving improving. Symptoms have been occurringNot influenced by the time of the day.  Symptoms are currently rated marked. Associated signs and symptoms include: poor social interaction and sadnessFinancial difficulties Health problems Marital or family conflict.   [name] filled out a therapy goals sheet to create a pre treatment plan for next session. He explains that he wants to work on his anxiety, depression, trauma, wants to be his old self again, self esteem, and anger. He explains that he wants to be able to actually talk to people. He reports that he wants to be able to control his anger. He notes that he wants to be able to have more self confidence.  He reports that he got a job at [employer] and starts tomorrow. He notes that he has previously worked there and needs some type of income coming in. He notes that he finally confronted his ex about his suspicions and she decided to leave. He notes that he is looking forward to working to get out of the house. He explains that his mood has been pretty low since the blow up he had with his ex. He explains that he is frustrated with her. He notes that his ex girlfriend spent all her money on dying her hair so he had to pay the cell phone bill and they didn't have money for food. He denies suicidal and homicidal thoughts.   Therapeutic Interventions: Cognitive Behavioral therapy was utilized by the clinician during today's session. Clinician provided the patient with a therapy goal hand out sheet and discussed what he would like to work on while in therapy. Clinician congratulated the patient on his new job. Clinician processed with the patient regarding how things are going at home. Clinician provided the patient with a hand out for food  pantries. Clinician encouraged the patient to focus on the positives and starting his new job.   Return visit in 1 week.    Effectiveness:  Established problem, stable/improving (1). Progressing It is felt more time is needed for Interventions to work.  . Patient is fully  Other:  orientated to time and place. Patient's Appropriate into problems. Active. Thought process is  Coherent.Minimal: No identifiable suicidal ideation.  Patients presenting with no risk factors but with morbid ruminations; may be classified as minimal risk based on the severity of the depressive symptoms and None.   Homework: Practice self care and relax.  Plan: Follow up with Carey BullocksHeather Simpson, LCSW at [facility] in one week.

## 2017-05-11 NOTE — Progress Notes (Signed)
Subjective:    Patient ID: Carlos [name], male    DOB: [birth year], 32 y.o.   MRN: [id number]  HPI  Carlos [name] is 32 yo male here for BP check and med refill. BP has improved and is at 130/88 from 160/91 on 04/13/17.  Pt reports he has been low on food for the last month. He has some support from friends.  He reports recent split from his girlfriend and has been feeling low. He is seeing Research scientist (physical sciences)Heather for counseling.  Pt requests eye exam.   Patient Active Problem List   Diagnosis Date Noted  . Hyperlipidemia associated with type 2 diabetes mellitus (HCC) 03/30/2017  . Bipolar 2 disorder, major depressive episode (HCC) 03/19/2017  . Cannabis use disorder, moderate, dependence (HCC)   . Chronic posttraumatic stress disorder   . Generalized abdominal pain 01/31/2017  . Depression 01/31/2017  . Stage 3 chronic kidney disease (HCC) 06/15/2016  . H/O medication noncompliance 06/07/2016  . Obesity 06/07/2016  . Erectile dysfunction 06/07/2016  . Type 2 diabetes mellitus with complication (HCC) 06/07/2016  . Hypertensive urgency 06/06/2016  . Chest pain 05/23/2013  . SOB (shortness of breath) 05/23/2013  . Essential hypertension    Allergies as of 05/11/2017   No Known Allergies     Medication List        Accurate as of 05/11/17  6:41 PM. Always use your most recent med list.          amLODipine 10 MG tablet Commonly known as:  NORVASC Take 1 tablet (10 mg total) by mouth daily.   divalproex 500 MG DR tablet Commonly known as:  DEPAKOTE Take 1 tablet (500 mg total) by mouth every 12 (twelve) hours.   gabapentin 100 MG capsule Commonly known as:  NEURONTIN Take 1 capsule (100 mg total) by mouth 3 (three) times daily.   hydrocortisone cream 1 % Apply 1 application topically 2 (two) times daily.   insulin aspart 100 UNIT/ML injection Commonly known as:  NOVOLOG Use as directed per SSI Novolog Moderate SSI (0-15 units) TID before meals: Less than 70 mg/dl- Drink 4  ounces juice & Recheck CBG in 15 minutes 70-120 mg/dl- 0 units 409-811121-150 mg/dl- 2 units 914-782151-200 mg/dl- 3 units 956-213201-250 mg/dl- 5 units 086-578251-300 mg/dl- 8 units 469-629301-350 mg/dl- 11 units 528-413351-400 mg/dl- 15 units   Insulin Glargine 300 UNIT/ML Sopn Commonly known as:  TOUJEO MAX SOLOSTAR Inject 30 Units into the skin every morning.   Insulin Pen Needle 32G X 5 MM Misc 1 each by Does not apply route 3 (three) times daily.   Insulin Syringe 27G X 1/2" 0.5 ML Misc 1 each by Does not apply route 3 (three) times daily.   lisinopril 40 MG tablet Commonly known as:  PRINIVIL,ZESTRIL Take 1 tablet (40 mg total) by mouth daily.   metFORMIN 1000 MG tablet Commonly known as:  GLUCOPHAGE Take 1 tablet (1,000 mg total) by mouth daily with breakfast.        Review of Systems BP is at target.     Objective:   Physical Exam  Constitutional: He is oriented to person, place, and time. He appears well-developed and well-nourished.  Cardiovascular: Normal rate, regular rhythm and normal heart sounds.  Pulmonary/Chest: Effort normal and breath sounds normal.  Abdominal: Soft. Bowel sounds are normal.  Neurological: He is alert and oriented to person, place, and time.  Vitals reviewed.   BP 130/88 (BP Location: Right Arm, Patient Position: Sitting, Cuff Size: Large)  Pulse (!) 101   Temp 98 F (36.7 C)   Wt 269 lb 14.4 oz (122.4 kg)   BMI 42.27 kg/m        Assessment & Plan:  Continue current therapy.  Encouraged continued healthy lifestyle.  Refilled all meds.  BMET Lab tonight.   Referral for eye exam.   F/u in 3 mo.

## 2017-05-12 LAB — BASIC METABOLIC PANEL
BUN / CREAT RATIO: 10 (ref 9–20)
BUN: 16 mg/dL (ref 6–20)
CO2: 22 mmol/L (ref 20–29)
CREATININE: 1.62 mg/dL — AB (ref 0.76–1.27)
Calcium: 9.7 mg/dL (ref 8.7–10.2)
Chloride: 103 mmol/L (ref 96–106)
GFR calc Af Amer: 65 mL/min/{1.73_m2} (ref >59)
GFR, EST NON AFRICAN AMERICAN: 56 mL/min/{1.73_m2} — AB (ref >59)
Glucose: 141 mg/dL — ABNORMAL HIGH (ref 65–99)
Potassium: 4.7 mmol/L (ref 3.5–5.2)
SODIUM: 142 mmol/L (ref 134–144)

## 2017-05-15 ENCOUNTER — Telehealth: Payer: Self-pay | Admitting: Pharmacist

## 2017-05-15 NOTE — Telephone Encounter (Signed)
05/15/17 Received pharmacy printouts for the following meds: Toujeo Max Solostar Inject 30 units under the skin every morning #2boxes Novolog Vial Max daily dose 45 units=Sliding scale 3 times daily before meals-if BG <70 =4 oz. juice and recheck BG in 15 min. 70-120=0 units, 121-150=2 units, 151-200=3 units, 201-250=5 units, 251-300=8 units, 301-350=11 units, 351-400=15 units.  Printed [employer] [facility] [employer] applications, [facility] for [facility]ah to sign, mailing patient his portion to sign & return with income/support & taxes/[id number].Forde RadonAJ

## 2017-05-18 ENCOUNTER — Ambulatory Visit: Payer: Medicaid Other | Admitting: Licensed Clinical Social Worker

## 2017-05-18 ENCOUNTER — Telehealth: Payer: Self-pay

## 2017-05-18 ENCOUNTER — Other Ambulatory Visit: Payer: Self-pay | Admitting: Adult Health

## 2017-05-18 DIAGNOSIS — F3181 Bipolar II disorder: Secondary | ICD-10-CM

## 2017-05-18 MED ORDER — ACETAMINOPHEN 500 MG PO TABS: 1000.0000 mg | ORAL_TABLET | Freq: Three times a day (TID) | ORAL | 2 refills | Status: AC | PRN

## 2017-05-18 NOTE — Progress Notes (Unsigned)
Labs reviewed. Patient with poorly controlled diabetes and hypertension. Creatinine up to 1.67 from 1.38. Last HgA1C was 12.7. Blood pressure in the office was 151/112. Referral to endocrinology initiated. Patient is already on an ACEI. Change lisinopril to qpm and take amlodipine in am. Patient advised to keep home BP logs
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
## 2017-05-18 NOTE — Telephone Encounter (Signed)
-----   Message from Lewie LoronMagadalene S Tukov, NP sent at 05/18/2017 11:14 AM EST ----- Please schedule patient with endocrinology

## 2017-05-18 NOTE — Telephone Encounter (Signed)
Spoke with pt while in office. Pt does not have a blood pressure cuff so he will go to [facility] to check blood pressure at least 3-4 times per week. Gave print out of Blood pressure log to pt. He is scheduled for an endocrinology appt on 3/19. Instructed pt to take lisinopril at night. Pt understood all instructions

## 2017-05-23 ENCOUNTER — Ambulatory Visit: Payer: Medicaid Other | Admitting: Licensed Clinical Social Worker

## 2017-05-23 DIAGNOSIS — F3181 Bipolar II disorder: Secondary | ICD-10-CM
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
## 2017-05-25 ENCOUNTER — Ambulatory Visit: Payer: Medicaid Other | Admitting: Ophthalmology

## 2017-05-25 LAB — HM DIABETES EYE EXAM

## 2017-05-26 ENCOUNTER — Telehealth: Payer: Self-pay | Admitting: Pharmacist

## 2017-05-26 NOTE — Telephone Encounter (Signed)
05/26/2017 9:45:20 AM - Novolog Vial & Toujeo max Solostar  05/26/17 I have received the provider portion of applications for Novolog Vials and Toujeo Max Solostar back signed. Holding for patient to return his portion with income requested, sent to patient 05/15/17.Forde RadonAJ

## 2017-05-30 ENCOUNTER — Ambulatory Visit: Payer: Self-pay

## 2017-06-01 ENCOUNTER — Ambulatory Visit: Payer: Medicaid Other | Admitting: Licensed Clinical Social Worker

## 2017-06-01 DIAGNOSIS — F411 Generalized anxiety disorder: Secondary | ICD-10-CM

## 2017-06-01 NOTE — Progress Notes (Signed)
Subjective:  Patient ID: Carlos [name], male   DOB: [birth year], 32 y.o.   MRN: [id number]   "I'd like to find a way to keep myself calm and relax when I need to."  Sharia ReeveJosh will develop coping skills/distraction techniques to use when he is panicked, stressed, and anxious as needed per episode for three months. Target Date: 08/18/2017  Increase emotional regulation  Carlos [name] presents with anxiety. Onset of symptoms was several years ago with gradually improving improving. Symptoms have been occurringNot influenced by the time of the day.  Symptoms are currently rated marked. Associated signs and symptoms include: attention difficultiesFinancial difficulties Health problems Marital or family conflict.   He reports that things have been good at work. He reports that he becomes anxious when he has to spend money and bought a car but needs a little work. He explains that he is frustrated with the living situation with his ex because she has been hostile towards him and treats him like crap. He notes that the agreement was that he and his ex would be friends but she is acting differently towards him. He explains that he has been having trouble on focusing and keeping track of things he needs to do. He notes that he is trying to save money and not splurge on things he doesn't need. He explains that he is also irritated with the fact that he has to take care of all their pets: 2 turtles, 4 cats, and 3 dogs. He reports that he has been playing video games, fixing up his car, and working to cope with everything. He reports that he is going to join [employer] in order to get back in shape. He denies suicidal and homicidal thoughts.   Therapeutic Interventions: Cognitive Behavioral therapy and Supportive therapy was utilized by the clinician focusing on anxiety and affect on normal cognition. Clinician processed with the patient regarding how he has been doing since the last session. Clinician utilized  reflective listening by encouraging the patient to ventilate his feelings of frustration towards his current situation. Clinician congratulated the patient on purchasing a new car. Clinician processed with the patient regarding how she could be hostile towards him because he is moving forward and not having to rely on her for things that he needs. Clinician processed with the patient regarding how his anxiety is affecting is ability to keep track of appointments and focus on what he needs to do. Clinician suggested that he put appointment reminders in his phone and set an alarm to remember to take her medications. Clinician asked the patient what he is doing in order to cope with things and relax when he needs to.   Return visit in 1 week.    Effectiveness: Established problem, stable/improving (1). Progressing It is felt more time is needed for Interventions to work. Some progress towards goals. . Patient is fully Other:  orientated to time and place. Patient's Appropriate into problems. Active. Thought process is  Coherent.Minimal: No identifiable suicidal ideation.  Patients presenting with no risk factors but with morbid ruminations; may be classified as minimal risk based on the severity of the depressive symptoms and None.   Homework: Set reminders in phone for appointments and an alarm to remember to take medications. Continue to utilize coping skills.   Plan: Follow up with Carey BullocksHeather Shirely Toren, LCSW at [facility] in one week.

## 2017-06-08 ENCOUNTER — Telehealth: Payer: Self-pay | Admitting: Licensed Clinical Social Worker

## 2017-06-08 ENCOUNTER — Ambulatory Visit: Payer: Self-pay | Admitting: Licensed Clinical Social Worker

## 2017-06-08 NOTE — Telephone Encounter (Signed)
Clinician contacted patient due to being late to 12:00pm appointment and left a voicemail message. She explained that if the patient wanted to reschedule for today that she had a cancellation at 2 pm.

## 2017-06-13 ENCOUNTER — Ambulatory Visit: Payer: Self-pay | Admitting: Licensed Clinical Social Worker

## 2017-06-13 ENCOUNTER — Telehealth: Payer: Self-pay | Admitting: Adult Health Nurse Practitioner

## 2017-06-13 NOTE — Telephone Encounter (Signed)
Called and thought appt was on Thursday, needs to reschedule to been seen ASAP

## 2017-06-15 ENCOUNTER — Ambulatory Visit: Payer: Medicaid Other | Admitting: Licensed Clinical Social Worker

## 2017-06-15 DIAGNOSIS — F411 Generalized anxiety disorder: Secondary | ICD-10-CM

## 2017-06-15 DIAGNOSIS — F3181 Bipolar II disorder: Secondary | ICD-10-CM

## 2017-06-15 NOTE — Progress Notes (Signed)
Subjective:  Patient ID: Carlos MusicJoshua David [name], male   DOB: 26-Nov-1985, 32 y.o.   MRN: [id number]    Increase emotional regulation  Carlos [name] presents with anxiety. Onset of symptoms was several years with gradually improving improving. Symptoms have been occurringNot influenced by the time of the day.  Symptoms are currently rated marked. Associated signs and symptoms include: anger and attention difficultiesFinancial difficulties Marital or family conflict.   He reports that his stress level has been high and he's constantly tired. He notes that his feet are hurting and thinks its because of the neuropathy. He notes that he is working at [employer] three days a week and [employer] third shift five days a week. He notes that with his pain and stress level, he wonders if he should apply for disability. He notes that he hasn't been able to hang out with his friends much because of work and lack of money. He notes that he is having to push himself to do things. He explains that he has had a hard time not focusing on the past and try to move forward but it's been very difficult lately. He reports that he is getting an apartment with a friend but it won't be ready until April 26 th and it was the biggest relief that he has had in a long time. He reports that he applied for food stamps and now has them to help with the cost of the food. He denies suicidal and homicidal thoughts.   Therapeutic Interventions: Cognitive Behavioral therapy was utilized by the clinician focusing on patient's anxiety and effect on normal cognition. Clinician encouraged the patient to ventilate his feelings about his current situation. Clinician congratulated the patient on his second job. Clinician explained that the pain could be due to his stress, anxiety, lack of sleep due to working third shift, and diabetes. Clinician explained that she thinks he is under a lot of pressure and he should hold off on his decision about whether or not to  apply for disability. Clinician explained that he only has a few more weeks before he can move and get out of the toxic environment he is living in. Clinician encouraged the patient to focus on the positive and practice self care.   Return visit in 1 week.    Effectiveness:  Established problem, stable/improving (1). Progressing It is felt more time is needed for Interventions to work.  . Patient is fully Other:  orientated to time and place. Patient's Appropriate into problems. Active. Thought process is  Coherent.Minimal: No identifiable suicidal ideation.  Patients presenting with no risk factors but with morbid ruminations; may be classified as minimal risk based on the severity of the depressive symptoms and None.   Homework: Focus on the positive and practice self care.   Plan: Follow up with Carey BullocksHeather Simpson, LCSW at [facility] in one week or earlier.

## 2017-06-16 ENCOUNTER — Telehealth: Payer: Self-pay | Admitting: Pharmacy Technician

## 2017-06-16 NOTE — Telephone Encounter (Signed)
Patient failed to provide 2019 financial documentation.  No additional medication assistance will be provided by [facility] without the required proof of income documentation.  Patient notified by letter.  Jahmire Ruffins J. Cristalle Rohm Care Manager [facility] 

## 2017-06-20 ENCOUNTER — Ambulatory Visit: Payer: Self-pay | Admitting: Licensed Clinical Social Worker

## 2017-06-21 ENCOUNTER — Other Ambulatory Visit: Payer: Self-pay | Admitting: Ophthalmology

## 2017-06-27 ENCOUNTER — Telehealth: Payer: Self-pay | Admitting: Licensed Clinical Social Worker

## 2017-06-27 NOTE — Telephone Encounter (Signed)
Clinician contacted patient to reschedule missed appointment from last week. She rescheduled him for this Thursday April 18th at 3:30 pm.

## 2017-06-29 ENCOUNTER — Ambulatory Visit: Payer: Medicaid Other | Admitting: Licensed Clinical Social Worker

## 2017-06-29 DIAGNOSIS — F411 Generalized anxiety disorder: Secondary | ICD-10-CM

## 2017-06-29 NOTE — Progress Notes (Signed)
Subjective:  Patient ID: Carlos [name], male   DOB: 10-Dec-1985, 32 y.o.   MRN: [id number]    Increase emotional regulation  [name]  presents with stress and anxiety. Onset of symptoms was several years with gradually improving improving. Symptoms have been occurringNot influenced by the time of the day.  Symptoms are currently rated moderate. Associated signs and symptoms include: attention difficulties and sadnessFinancial difficulties Health problems.   He reports that he has eben dealing with stress due to moving, having issues with medication management, and pain in his feet. He explains that his pain in his feet can be unbreakable and work and has caused him to limp. He reports that he has to turn in documents in order to continue receiving his medications there and it's been stressful trying to get all the documents together. He notes that with working third shift that he is sleeping four to five hours a day. He notes that he is looking forward to moving out with his friend and getting away from the drama. He explains that he plans to get a job at [employer] so he can work one job instead of two and one where he doesn't have to be on his feet all the time. He denies suicidal and homicidal thoughts.   Therapeutic Interventions: Cognitive Behavioral therapy was utilized by the clinician during today's session was on patient's anxiety and effect on normal cognition. Clinician utilized reflective listening, encouraging him to ventilate his feelings towards his situation. Clinician processed with the patient regarding the factors contributing to his stress. Clinician suggested that he gather the documents to take on Monday to [facility]. Clinician processed with the patient regarding that she thinks getting another job is a good idea. Clinician explained that despite the stress now that it will be worth it once, he moves.   Return visit in 1 week.    Effectiveness:  Established  problem, stable/improving (1). Progressing It is felt more time is needed for Interventions to work.  . Patient is fully Other:  orientated to time and place. Patient's Appropriate into problems. Active. Thought process is  Coherent.Minimal: No identifiable suicidal ideation.  Patients presenting with no risk factors but with morbid ruminations; may be classified as minimal risk based on the severity of the depressive symptoms and None.   Homework: Practice self care and utilize relaxation techniques.   Plan: Follow up with Carey BullocksHeather Simpson, LCSW at [facility] in one week or earlier.

## 2017-07-04 ENCOUNTER — Ambulatory Visit: Payer: Self-pay | Admitting: Licensed Clinical Social Worker

## 2017-07-06 ENCOUNTER — Ambulatory Visit: Payer: Medicaid Other | Admitting: Adult Health Nurse Practitioner

## 2017-07-06 ENCOUNTER — Ambulatory Visit: Payer: Medicaid Other | Admitting: Licensed Clinical Social Worker

## 2017-07-06 DIAGNOSIS — E1140 Type 2 diabetes mellitus with diabetic neuropathy, unspecified: Secondary | ICD-10-CM

## 2017-07-06 DIAGNOSIS — Z794 Long term (current) use of insulin: Principal | ICD-10-CM

## 2017-07-06 DIAGNOSIS — F411 Generalized anxiety disorder: Secondary | ICD-10-CM

## 2017-07-06 MED ORDER — GABAPENTIN 300 MG PO CAPS: 300.0000 mg | ORAL_CAPSULE | Freq: Two times a day (BID) | ORAL | 3 refills | Status: AC

## 2017-07-06 NOTE — Progress Notes (Signed)
Subjective:    Patient ID: Carlos [name], male    DOB: 07-24-1985, 32 y.o.   MRN: [id number]  HPI  Carlos [name] is a 32 yo male here for complaints of foot pain. He reports the pain is so bad that he cannot make it through a work shift on his feet. He describes the pain is a range from tingling to sharp. He reports Gabapentin 100mg  TID gave some relief but not enough.   Diabetes: Pt is taking 2-3 units sliding scale of insulin and Toujeo 30 units.  Patient Active Problem List   Diagnosis Date Noted  . Hyperlipidemia associated with type 2 diabetes mellitus (HCC) 03/30/2017  . Bipolar 2 disorder, major depressive episode (HCC) 03/19/2017  . Cannabis use disorder, moderate, dependence (HCC)   . Chronic posttraumatic stress disorder   . Generalized abdominal pain 01/31/2017  . Depression 01/31/2017  . Stage 3 chronic kidney disease (HCC) 06/15/2016  . H/O medication noncompliance 06/07/2016  . Obesity 06/07/2016  . Erectile dysfunction 06/07/2016  . Type 2 diabetes mellitus with complication (HCC) 06/07/2016  . Hypertensive urgency 06/06/2016  . Chest pain 05/23/2013  . SOB (shortness of breath) 05/23/2013  . Essential hypertension    Allergies as of 07/06/2017   No Known Allergies     Medication List        Accurate as of 07/06/17  7:33 PM. Always use your most recent med list.          amLODipine 10 MG tablet Commonly known as:  NORVASC Take 1 tablet (10 mg total) by mouth daily.   divalproex 500 MG DR tablet Commonly known as:  DEPAKOTE Take 1 tablet (500 mg total) by mouth every 12 (twelve) hours.   gabapentin 100 MG capsule Commonly known as:  NEURONTIN Take 1 capsule (100 mg total) by mouth 3 (three) times daily.   hydrocortisone cream 1 % Apply 1 application topically 2 (two) times daily.   insulin aspart 100 UNIT/ML injection Commonly known as:  NOVOLOG Use as directed per SSI Novolog Moderate SSI (0-15 units) TID before meals: Less than 70  mg/dl- Drink 4 ounces juice & Recheck CBG in 15 minutes 70-120 mg/dl- 0 units 409-811 mg/dl- 2 units 914-782 mg/dl- 3 units 956-213 mg/dl- 5 units 086-578 mg/dl- 8 units 469-629 mg/dl- 11 units 528-413 mg/dl- 15 units   Insulin Glargine 300 UNIT/ML Sopn Commonly known as:  TOUJEO MAX SOLOSTAR Inject 30 Units into the skin every morning.   Insulin Pen Needle 32G X 5 MM Misc 1 each by Does not apply route 3 (three) times daily.   Insulin Syringe 27G X 1/2" 0.5 ML Misc 1 each by Does not apply route 3 (three) times daily.   lisinopril 40 MG tablet Commonly known as:  PRINIVIL,ZESTRIL Take 1 tablet (40 mg total) by mouth daily.   metFORMIN 1000 MG tablet Commonly known as:  GLUCOPHAGE Take 1 tablet (1,000 mg total) by mouth daily with breakfast.        Review of Systems Last A1C was 12.7    Objective:   Physical Exam  Constitutional: He is oriented to person, place, and time. He appears well-developed and well-nourished.  Cardiovascular: Normal rate, regular rhythm and normal heart sounds.  Pulmonary/Chest: Effort normal and breath sounds normal.  Abdominal: Soft. Bowel sounds are normal.  Musculoskeletal:       Feet:  Neurological: He is alert and oriented to person, place, and time.  Vitals reviewed.   BP 122/86  Pulse 93   Temp 98 F (36.7 C)   Wt 276 lb 14.4 oz (125.6 kg)   BMI 43.37 kg/m       Assessment & Plan:   Neuropathy in feet:  Increase Gabapentin to 300mg  with starting once daily at night and then work up to 2 dependent on symptoms. Roll feet on frozen water bottles, supportive care with OTC rubs and lineaments.   Diabetes: Check A1C tonight. If still elevated, add Trulicity.   F/u in 4 weeks for diabetes and med eval .

## 2017-07-06 NOTE — Progress Notes (Signed)
Subjective:  Patient ID: Carlos [name], male   DOB: [birth year], 32 y.o.   MRN: [id number]    Increase emotional regulation  [name]  presents with anxiety. Onset of symptoms was several years with gradually improving improving. Symptoms have been occurringNot influenced by the time of the day.  Symptoms are currently rated moderate. Associated signs and symptoms include: attention difficulties and difficulty sleeping.Health problems Other: moving on May 1st.   He notes that he has been doing okay. He explains that his allergies have been bothering him. He reports that he reached his breaking point with [employer] and the pain in his feet so he quit last night. He notes that he has done the math and has decided that he can make enough money at [employer]. He explains that his anxiety is up due to moving on May 1st and needing to get things ready. He notes that he is excited to move but wishes he could get some rest and his feet wasn't hurting so bad. He notes that he still has yet to turn in his documents at [facility] and pick up his medications because he keeps forgetting. He denies suicidal and homicidal thoughts.   Therapeutic Interventions: Cognitive Behavioral therapy was utilized by the clinician focusing on patient's anxiety and effect on normal cognition. Clinician processed with the patient regarding how he has been doing since the last session. Clinician processed with the patient regarding that she thinks he made the right decision quitting [employer]. Clinician processed with the patient regarding that hopefully now he can get adequate rest, practice self care, and not be in so much pain. Clinician processed with the patient regarding that she thinks that once he is moved into his new apartment with his best friend that things will calm down and he won't be as stressed. Clinician suggested that the patient place a reminder in his phone to remember appointments, to go to  medications management, and other things he needs to get.   Return visit in 1 week.    Effectiveness:  Established problem, stable/improving (1). Progressing It is felt more time is needed for Interventions to work.  . Patient is fully or not fully Other:  orientated to time and place. Patient's Appropriate into problems. Active. Thought process is  Coherent.Minimal: No identifiable suicidal ideation.  Patients presenting with no risk factors but with morbid ruminations; may be classified as minimal risk based on the severity of the depressive symptoms and None.   Homework: Practice self care and go to [facility] to pick up medications.   Plan: Follow up with Carey BullocksHeather Mirren Gest, LCSW at [facility] in one week or earlier.

## 2017-07-07 LAB — BASIC METABOLIC PANEL
BUN / CREAT RATIO: 13 (ref 9–20)
BUN: 20 mg/dL (ref 6–20)
CALCIUM: 9.5 mg/dL (ref 8.7–10.2)
CHLORIDE: 102 mmol/L (ref 96–106)
CO2: 20 mmol/L (ref 20–29)
Creatinine, Ser: 1.55 mg/dL — ABNORMAL HIGH (ref 0.76–1.27)
GFR, EST AFRICAN AMERICAN: 68 mL/min/{1.73_m2} (ref >59)
GFR, EST NON AFRICAN AMERICAN: 59 mL/min/{1.73_m2} — AB (ref >59)
Glucose: 137 mg/dL — ABNORMAL HIGH (ref 65–99)
POTASSIUM: 4.4 mmol/L (ref 3.5–5.2)
SODIUM: 140 mmol/L (ref 134–144)

## 2017-07-07 LAB — HEMOGLOBIN A1C
Est. average glucose Bld gHb Est-mCnc: 148 mg/dL
Hgb A1c MFr Bld: 6.8 % — ABNORMAL HIGH (ref 4.8–5.6)

## 2017-07-11 ENCOUNTER — Ambulatory Visit: Payer: Medicaid Other | Admitting: Licensed Clinical Social Worker

## 2017-07-11 DIAGNOSIS — F411 Generalized anxiety disorder: Secondary | ICD-10-CM

## 2017-07-12 NOTE — Progress Notes (Signed)
Subjective:  Patient ID: Carlos [name], male   DOB: Sep 07, 1985, 32 y.o.   MRN: [id number]    Increase emotional regulation  Carlos [name]  presents with anxiety. Onset of symptoms was several years with gradually improving improving. Symptoms have been occurringNot influenced by the time of the day.  Symptoms are currently rated moderate. Associated signs and symptoms include: attention difficulties and difficulty sleeping.Financial difficulties Health problems.   He reports that he is exhausted. He notes that he moves tomorrow and still has plenty to pack. He explains that he has yet to go to [facility] even though the last time he was here that he set an alarm in his phone and it didn't go off when it was supposed to. He notes that he is glad to be able to get away from his ex but is still harboring resentment and anger towards her. He explains that he wants to fully move on and is officially dating someone new. He describes that feeling dead inside and feels like he can't give too much of himself due to the damage that was created by his ex girlfriend. He notes that he will be working thirty hours a week or more at [employer]. He denies suicidal and homicidal thoughts.   Therapeutic Interventions: Cognitive Behavioral therapy was utilized by the clinician focusing patient's anxiety and effect on normal cognition. Clinician processed with the patient regarding how he has been doing since the last session. Clinician asked the patient if he followed up with [facility]ication [facility] because she believes that if he had all the medications in his system that he is supposed to that he would be feeling a lot better. Clinician suggested that the patient go first thing tomorrow. Clinician explained that once he is completely moved out of his girlfriend's house and has his dog that he should feel a sigh of relief. Clinician explained that he is still grieving the loss of the relationship and it's going to  take time to heal. Clinician processed with the patient regarding that whether or not he has things sorted out his job.   Return visit in 1 week.    Effectiveness:  Established problem, stable/improving (1). Progressing It is felt more time is needed for Interventions to work.  . Patient is fully or not fully Other:  orientated to time and place. Patient's Appropriate into problems. Active. Thought process is  Coherent.Minimal: No identifiable suicidal ideation.  Patients presenting with no risk factors but with morbid ruminations; may be classified as minimal risk based on the severity of the depressive symptoms and None.   Homework: Practice self care and utilize relaxation techniques. Go to [facility] to pick up medications.   Plan: Follow up with Carlos Bullocks, LCSW at [facility] in one week or earlier if needed.

## 2017-07-17 ENCOUNTER — Ambulatory Visit: Payer: Medicaid Other | Admitting: Pharmacy Technician

## 2017-07-17 DIAGNOSIS — Z79899 Other long term (current) drug therapy: Secondary | ICD-10-CM

## 2017-07-18 ENCOUNTER — Ambulatory Visit: Payer: Medicaid Other | Admitting: Licensed Clinical Social Worker

## 2017-07-18 ENCOUNTER — Ambulatory Visit: Payer: Medicaid Other | Admitting: Adult Health Nurse Practitioner

## 2017-07-18 ENCOUNTER — Encounter: Payer: Self-pay | Admitting: Licensed Clinical Social Worker

## 2017-07-18 ENCOUNTER — Telehealth: Payer: Self-pay | Admitting: Pharmacy Technician

## 2017-07-18 DIAGNOSIS — F411 Generalized anxiety disorder: Secondary | ICD-10-CM

## 2017-07-18 DIAGNOSIS — K122 Cellulitis and abscess of mouth: Secondary | ICD-10-CM | POA: Insufficient documentation

## 2017-07-18 MED ORDER — AMOXICILLIN-POT CLAVULANATE 875-125 MG PO TABS: 1.0000 | ORAL_TABLET | Freq: Two times a day (BID) | ORAL | 0 refills | Status: DC

## 2017-07-18 NOTE — Progress Notes (Signed)
Completed [facility] application for recertification.    Patient approved to receive medication assistance at [facility] through 2019, as long as eligibility criteria continues to be met.  [facility]

## 2017-07-18 NOTE — Telephone Encounter (Signed)
Still need 2018 tax return.  Sherilyn Dacosta Care Manager [facility]

## 2017-07-18 NOTE — Progress Notes (Signed)
Subjective:  Patient ID: Carlos [name], male   DOB: [birth year], 32 y.o.   MRN: [id number]    Increase emotional regulation  [name] presents with anxiety. Onset of symptoms was several years with gradually improving improving. Symptoms have been occurringNot influenced by the time of the day.  Symptoms are currently rated moderate. Associated signs and symptoms include: attention difficulties and excessive worrying. Financial difficulties Health problems.   He notes that he has moved in with his friend and is glad to be out of his ex girlfriend's apartment. He notes that he is feeling a lot better. He notes that overall he is less anxious due to not having to deal with his ex, new job, and new relationship. He reports that he still worries about work hours and making enough money to pay bills. He notes that he worries about saving up enough money for extra things. He notes that he is also worrying about pain he has been having pain in his jaw for the last couple of days to the point where its unbearable. He denies suicidal and homicidal thoughts.   Therapeutic Interventions: Cognitive Behavioral therapy was utilized by the clinician during today's session focusing on patient's symptoms of anxiety and effect on normal cognition. Clinician processed with the patient regarding how he has been doing since the last session. Clinician congratulated the patient on moving out of his ex's and moving forward. Clinician processed with the patient regarding how he has been doing anxiety wise. Clinician processed with the patient regarding what are the sources of his anxiety. Clinician encouraged the patient to not stress as much about money now that he is working enough hours to not only pay his bills but save money. Clinician suggested that he see a provider in clinic for the pain in his jaw. Clinician encouraged the patient to practice self care.   Return visit in 2 weeks.    Effectiveness:  Established  problem, stable/improving (1). Progressing It is felt more time is needed for Interventions to work.  . Patient is fully or not fully Other:  orientated to time and place. Patient's Appropriate into problems. Active. Thought process is  Coherent.Minimal: No identifiable suicidal ideation.  Patients presenting with no risk factors but with morbid ruminations; may be classified as minimal risk based on the severity of the depressive symptoms and None.   Homework: Practice self care and utilize relaxation techniques.   Plan: Follow up with Carey Bullocks, LCSW at [facility] in two weeks or earlier.

## 2017-07-18 NOTE — Progress Notes (Signed)
Subjective:    Patient ID: Kalijah Zeiss, male    DOB: June 25, 1985, 32 y.o.   MRN: [id number]  HPI  Logen Heintzelman is a 32 yo M here for f/u for jaw pain.  R jaw pain: started 3 days ago. Pt reports pain has improved today but it is constant at 6. He has been taking Tylenol with swelling relief but no pain relief. He denies dental problems. HTN: BP elevated tonight at 175/118.   Patient Active Problem List   Diagnosis Date Noted  . Submandibular abscess 07/18/2017  . Type 2 diabetes mellitus with diabetic neuropathy, unspecified (HCC) 07/06/2017  . Hyperlipidemia associated with type 2 diabetes mellitus (HCC) 03/30/2017  . Bipolar 2 disorder, major depressive episode (HCC) 03/19/2017  . Cannabis use disorder, moderate, dependence (HCC)   . Chronic posttraumatic stress disorder   . Generalized abdominal pain 01/31/2017  . Depression 01/31/2017  . Stage 3 chronic kidney disease (HCC) 06/15/2016  . H/O medication noncompliance 06/07/2016  . Obesity 06/07/2016  . Erectile dysfunction 06/07/2016  . Type 2 diabetes mellitus with complication (HCC) 06/07/2016  . Hypertensive urgency 06/06/2016  . Chest pain 05/23/2013  . SOB (shortness of breath) 05/23/2013  . Essential hypertension    Allergies as of 07/18/2017   No Known Allergies     Medication List        Accurate as of 07/18/17  6:43 PM. Always use your most recent med list.          amLODipine 10 MG tablet Commonly known as:  NORVASC Take 1 tablet (10 mg total) by mouth daily.   amoxicillin-clavulanate 875-125 MG tablet Commonly known as:  AUGMENTIN Take 1 tablet by mouth 2 (two) times daily.   divalproex 500 MG DR tablet Commonly known as:  DEPAKOTE Take 1 tablet (500 mg total) by mouth every 12 (twelve) hours.   gabapentin 300 MG capsule Commonly known as:  NEURONTIN Take 1 capsule (300 mg total) by mouth 2 (two) times daily.   hydrocortisone cream 1 % Apply 1 application topically 2 (two) times  daily.   hydrOXYzine 10 MG tablet Commonly known as:  ATARAX/VISTARIL Take by mouth 3 (three) times daily as needed (Doesn't know dosage).   insulin aspart 100 UNIT/ML injection Commonly known as:  NOVOLOG Use as directed per SSI Novolog Moderate SSI (0-15 units) TID before meals: Less than 70 mg/dl- Drink 4 ounces juice & Recheck CBG in 15 minutes 70-120 mg/dl- 0 units 253-664 mg/dl- 2 units 403-474 mg/dl- 3 units 259-563 mg/dl- 5 units 875-643 mg/dl- 8 units 329-518 mg/dl- 11 units 841-660 mg/dl- 15 units   Insulin Glargine 300 UNIT/ML Sopn Commonly known as:  TOUJEO MAX SOLOSTAR Inject 30 Units into the skin every morning.   Insulin Pen Needle 32G X 5 MM Misc 1 each by Does not apply route 3 (three) times daily.   Insulin Syringe 27G X 1/2" 0.5 ML Misc 1 each by Does not apply route 3 (three) times daily.   lisinopril 40 MG tablet Commonly known as:  PRINIVIL,ZESTRIL Take 1 tablet (40 mg total) by mouth daily.   metFORMIN 1000 MG tablet Commonly known as:  GLUCOPHAGE Take 1 tablet (1,000 mg total) by mouth daily with breakfast.   traZODone 100 MG tablet Commonly known as:  DESYREL Take by mouth at bedtime.         Review of Systems  All other systems reviewed and are negative.      Objective:   Physical Exam  Constitutional: He is oriented to person, place, and time. He appears well-developed and well-nourished.  Neck:    Cardiovascular: Normal rate, regular rhythm and normal heart sounds.  Pulmonary/Chest: Effort normal and breath sounds normal.  Abdominal: Soft. Bowel sounds are normal.  Neurological: He is alert and oriented to person, place, and time.  Vitals reviewed.   BP (!) 175/118   Pulse 89   Wt 280 lb 11.2 oz (127.3 kg)   BMI 43.96 kg/m   Rechecked BP: 142/96    Assessment & Plan:   R submandibular abscess:  Rx Augmentin  BID for 10 days Advised pt to do warm salt water gargle and if symptoms worsen or experience difficult  swallowing or SOB to go to ED.   F/u in 1 week.

## 2017-07-24 ENCOUNTER — Telehealth: Payer: Self-pay | Admitting: Pharmacist

## 2017-07-24 NOTE — Telephone Encounter (Signed)
07/24/2017 3:47:08 PM - Nathen May Max Solostar  07/24/17 Faxed [employer] application for [employer] Max Solostar 30 units every AM, diagnosis code E11.65.AJ   07/24/2017 3:46:12 PM - Novolog Vials  07/24/17 Faxed [employer] application for [employer] Max daily dose 45 units==Sliding scale 3 times daily before meals-If BG <70=4oz. juice & recheck BG in 15 min. - 70-120=0 units, 121-150=2units, 151-200=3units, 201-250=5 units, 251-300=8units, 301-350=11units, 351-400=15 units.Forde Radon

## 2017-07-25 ENCOUNTER — Ambulatory Visit: Payer: Medicaid Other | Admitting: Family Medicine

## 2017-07-25 VITALS — BP 154/102 | Temp 97.8°F | Wt 278.8 lb

## 2017-07-25 DIAGNOSIS — E1140 Type 2 diabetes mellitus with diabetic neuropathy, unspecified: Secondary | ICD-10-CM

## 2017-07-25 DIAGNOSIS — E1159 Type 2 diabetes mellitus with other circulatory complications: Secondary | ICD-10-CM

## 2017-07-25 DIAGNOSIS — I10 Essential (primary) hypertension: Secondary | ICD-10-CM

## 2017-07-25 DIAGNOSIS — Z09 Encounter for follow-up examination after completed treatment for conditions other than malignant neoplasm: Secondary | ICD-10-CM

## 2017-07-25 DIAGNOSIS — F411 Generalized anxiety disorder: Secondary | ICD-10-CM

## 2017-07-25 DIAGNOSIS — I152 Hypertension secondary to endocrine disorders: Secondary | ICD-10-CM

## 2017-07-25 DIAGNOSIS — K122 Cellulitis and abscess of mouth: Secondary | ICD-10-CM

## 2017-07-25 DIAGNOSIS — Z794 Long term (current) use of insulin: Secondary | ICD-10-CM

## 2017-07-25 MED ORDER — AMLODIPINE BESYLATE 10 MG PO TABS: 10.0000 mg | ORAL_TABLET | Freq: Every day | ORAL | 3 refills | Status: AC

## 2017-07-25 MED ORDER — INSULIN ASPART 100 UNIT/ML ~~LOC~~ SOLN: SUBCUTANEOUS | 3 refills | Status: AC

## 2017-07-25 MED ORDER — INSULIN GLARGINE 300 UNIT/ML ~~LOC~~ SOPN: 30.0000 [IU] | PEN_INJECTOR | Freq: Every morning | SUBCUTANEOUS | 1 refills | Status: AC

## 2017-07-25 MED ORDER — "INSULIN SYRINGE 27G X 1/2"" 0.5 ML MISC": 1.0000 | Freq: Three times a day (TID) | 1 refills | Status: AC

## 2017-07-25 MED ORDER — INSULIN PEN NEEDLE 32G X 5 MM MISC: 1.0000 | Freq: Three times a day (TID) | 1 refills | Status: AC

## 2017-07-25 NOTE — Progress Notes (Signed)
Patient: Carlos [name] Male    DOB: April 18, 1985   32 y.o.   MRN: [id number] Visit Date: 07/28/2017  Today's Provider: Kallie Locks, FNP   Chief Complaint  Patient presents with  . Follow-up   Subjective:   HPI Patient is here today for follow up of Submandibular abscess. He states that he is feeling much better and swelling has resolved. He states that he has approximately 3 days left to complete antibiotic. He denies fevers, chills, weight loss, and night sweats.   He states that he does not have any pain or discomfort today. He has neuropathy in feet, which he takes Gabapentin for relief.   He does have mild anxiety.   He states that he has occasional dizziness/lightheadedness. Denies and seizures.   He states that he blood is elevated today, because he has not taken his blood pressure medications today. He denies headaches, lightheadedness, dizziness, chest pain, shortness of breath, cough, and heart palpitations.   He has a good appetite. He states that his bowel movements are normal. He denies abdominal pain, nausea, vomiting, diarrhea, and constipation.   He states that he needs refills on medications.   No Known Allergies Previous Medications   AMOXICILLIN-CLAVULANATE (AUGMENTIN) 875-125 MG TABLET    Take 1 tablet by mouth 2 (two) times daily.   DIVALPROEX (DEPAKOTE) 500 MG DR TABLET    Take 1 tablet (500 mg total) by mouth every 12 (twelve) hours.   GABAPENTIN (NEURONTIN) 300 MG CAPSULE    Take 1 capsule (300 mg total) by mouth 2 (two) times daily.   HYDROCORTISONE CREAM 1 %    Apply 1 application topically 2 (two) times daily.   HYDROXYZINE (ATARAX/VISTARIL) 10 MG TABLET    Take by mouth 3 (three) times daily as needed (Doesn't know dosage).   LISINOPRIL (PRINIVIL,ZESTRIL) 40 MG TABLET    Take 1 tablet (40 mg total) by mouth daily.   METFORMIN (GLUCOPHAGE) 1000 MG TABLET    Take 1 tablet (1,000 mg total) by mouth daily with breakfast.   TRAZODONE (DESYREL) 100 MG  TABLET    Take by mouth at bedtime.    Review of Systems  Constitutional: Negative.   HENT: Negative.   Eyes: Negative.   Respiratory: Negative.   Cardiovascular: Negative.   Gastrointestinal: Negative.   Endocrine: Negative.   Genitourinary: Negative.   Musculoskeletal: Negative.   Skin: Negative.   Allergic/Immunologic: Negative.   Neurological:       Neuropathy  Hematological: Negative.   Psychiatric/Behavioral: Negative.     Social History   Tobacco Use  . Smoking status: Former Smoker    Packs/day: 0.50    Years: 1.00    Pack years: 0.50    Types: Cigars    Last attempt to quit: 2016    Years since quitting: 3.3  . Smokeless tobacco: Never Used  Substance Use Topics  . Alcohol use: No    Comment: occasional   Objective:   BP (!) 154/102   Temp 97.8 F (36.6 C)   Wt 278 lb 12.8 oz (126.5 kg)   BMI 43.67 kg/m   Physical Exam  Constitutional: He is oriented to person, place, and time. He appears well-developed and well-nourished.  HENT:  Head: Normocephalic and atraumatic.  Right Ear: External ear normal.  Left Ear: External ear normal.  Nose: Nose normal.  Mouth/Throat: Oropharynx is clear and moist.  Eyes: Pupils are equal, round, and reactive to light. Conjunctivae and EOM are normal.  Neck: Normal  range of motion. Neck supple.  Cardiovascular: Normal rate, regular rhythm, normal heart sounds and intact distal pulses.  Pulmonary/Chest: Effort normal and breath sounds normal.  Abdominal: Soft. Bowel sounds are normal.  Musculoskeletal: Normal range of motion.  Neurological: He is alert and oriented to person, place, and time.  Skin: Skin is warm and dry. Capillary refill takes less than 2 seconds.   Assessment & Plan:  1. Essential hypertension BP is 154/102 today. He will continue to take Lisinopril, Norvasc, and Hydralazine as directed. We will refill her BP medications today. Patient is counseled to take blood pressure medications as directed.  Reassess at next OV.  - amLODipine (NORVASC) 10 MG tablet; Take 1 tablet (10 mg total) by mouth daily.  Dispense: 30 tablet; Refill: 3  2. Submandibular abscess Resolved. He has 3 days left to complete Augmentin for abscess. No swelling noted.   3. Generalized anxiety disorder Stable. He will continue to take Trazodone as directed.   4. Type 2 diabetes mellitus with diabetic neuropathy, with long-term current use of insulin (HCC) Much improved. Hgb A1c on 07/06/2017 has decreased to 6.8 from 12.7 on 03/20/2017. He will continue to exercise regularly and decrease high fat foods and carbohydrates, replacing them with more vegetables and increase water. Continue insulin and Metformin  as directed. We will reassess Hgb A1c in July and re-evaluate insulin dosage.   Neuropathy is stable. Not worsening. Continue Gabapentin as directed.   5. Hypertension associated with diabetes (HCC) See # 1.   6. Follow up We will follow up with him in 2 weeks to re-check blood pressure readings. Reassess Hgb A1c in July.   Meds ordered this encounter  Medications  . amLODipine (NORVASC) 10 MG tablet    Sig: Take 1 tablet (10 mg total) by mouth daily.    Dispense:  30 tablet    Refill:  3  . insulin aspart (NOVOLOG) 100 UNIT/ML injection    Sig: Use as directed per SSI Novolog Moderate SSI (0-15 units) TID before meals: Less than 70 mg/dl- Drink 4 ounces juice & Recheck CBG in 15 minutes 70-120 mg/dl- 0 units 161-096 mg/dl- 2 units 045-409 mg/dl- 3 units 811-914 mg/dl- 5 units 782-956 mg/dl- 8 units 213-086 mg/dl- 11 units 578-469 mg/dl- 15 units    Dispense:  10 mL    Refill:  3  . Insulin Glargine (TOUJEO MAX SOLOSTAR) 300 UNIT/ML SOPN    Sig: Inject 30 Units into the skin every morning.    Dispense:  9 mL    Refill:  1  . Insulin Pen Needle 32G X 5 MM MISC    Sig: 1 each by Does not apply route 3 (three) times daily.    Dispense:  100 each    Refill:  1  . Insulin Syringe 27G X 1/2" 0.5 ML  MISC    Sig: 1 each by Does not apply route 3 (three) times daily.    Dispense:  100 each    Refill:  1    Kallie Locks, FNP   [facility]

## 2017-07-28 ENCOUNTER — Encounter: Payer: Self-pay | Admitting: Family Medicine

## 2017-08-03 ENCOUNTER — Ambulatory Visit: Payer: Self-pay

## 2017-08-03 ENCOUNTER — Ambulatory Visit: Payer: Self-pay | Admitting: Licensed Clinical Social Worker

## 2017-08-08 ENCOUNTER — Telehealth: Payer: Self-pay | Admitting: Licensed Clinical Social Worker

## 2017-08-08 NOTE — Telephone Encounter (Signed)
Clinician left a voicemail with call back information for the client to see if he would like to reschedule his missed appointment on 08/03/2017.

## 2017-08-10 ENCOUNTER — Ambulatory Visit: Payer: Self-pay

## 2017-08-30 ENCOUNTER — Telehealth: Payer: Self-pay | Admitting: Licensed Clinical Social Worker

## 2017-08-30 NOTE — Telephone Encounter (Signed)
Clinician attempted to reach out to the patient regarding missing his last appointment to check in and see if he would like to reschedule. She left a voicemail with call back information.

## 2017-10-27 ENCOUNTER — Emergency Department: Payer: Medicaid Other

## 2017-10-27 ENCOUNTER — Encounter: Payer: Self-pay | Admitting: Emergency Medicine

## 2017-10-27 ENCOUNTER — Other Ambulatory Visit: Payer: Self-pay

## 2017-10-27 ENCOUNTER — Emergency Department
Admission: EM | Admit: 2017-10-27 | Discharge: 2017-10-27 | Discharge status: Against Medical Advice | Payer: Medicaid Other | Attending: Emergency Medicine | Admitting: Emergency Medicine

## 2017-10-27 DIAGNOSIS — Z87891 Personal history of nicotine dependence: Secondary | ICD-10-CM | POA: Insufficient documentation

## 2017-10-27 DIAGNOSIS — Z79899 Other long term (current) drug therapy: Secondary | ICD-10-CM | POA: Insufficient documentation

## 2017-10-27 DIAGNOSIS — Z794 Long term (current) use of insulin: Secondary | ICD-10-CM | POA: Insufficient documentation

## 2017-10-27 DIAGNOSIS — R079 Chest pain, unspecified: Secondary | ICD-10-CM | POA: Insufficient documentation

## 2017-10-27 DIAGNOSIS — N183 Chronic kidney disease, stage 3 (moderate): Secondary | ICD-10-CM | POA: Insufficient documentation

## 2017-10-27 DIAGNOSIS — E119 Type 2 diabetes mellitus without complications: Secondary | ICD-10-CM | POA: Insufficient documentation

## 2017-10-27 DIAGNOSIS — I129 Hypertensive chronic kidney disease with stage 1 through stage 4 chronic kidney disease, or unspecified chronic kidney disease: Secondary | ICD-10-CM | POA: Insufficient documentation

## 2017-10-27 DIAGNOSIS — I208 Other forms of angina pectoris: Secondary | ICD-10-CM

## 2017-10-27 LAB — TROPONIN I: Troponin I: <0.03 ng/mL (ref <0.03)

## 2017-10-27 LAB — CBC
HCT: 37.9 % — ABNORMAL LOW (ref 40.0–52.0)
HEMOGLOBIN: 13.1 g/dL (ref 13.0–18.0)
MCH: 29.8 pg (ref 26.0–34.0)
MCHC: 34.7 g/dL (ref 32.0–36.0)
MCV: 86 fL (ref 80.0–100.0)
Platelets: 322 10*3/uL (ref 150–440)
RBC: 4.4 MIL/uL (ref 4.40–5.90)
RDW: 13.7 % (ref 11.5–14.5)
WBC: 8.7 10*3/uL (ref 3.8–10.6)

## 2017-10-27 LAB — BASIC METABOLIC PANEL
ANION GAP: 8 (ref 5–15)
BUN: 21 mg/dL — AB (ref 6–20)
CO2: 23 mmol/L (ref 22–32)
Calcium: 9 mg/dL (ref 8.9–10.3)
Chloride: 104 mmol/L (ref 98–111)
Creatinine, Ser: 1.46 mg/dL — ABNORMAL HIGH (ref 0.61–1.24)
GFR calc Af Amer: >60 mL/min (ref >60)
GLUCOSE: 279 mg/dL — AB (ref 70–99)
Potassium: 3.8 mmol/L (ref 3.5–5.1)
SODIUM: 135 mmol/L (ref 135–145)

## 2017-10-27 LAB — FIBRIN DERIVATIVES D-DIMER (ARMC ONLY): FIBRIN DERIVATIVES D-DIMER (ARMC): 239.4 ng{FEU}/mL (ref 0.00–499.00)

## 2017-10-27 NOTE — ED Triage Notes (Signed)
Pain with deep breath in chst. Says he was hit by a tree 2 days ago in  Chest.

## 2017-10-27 NOTE — ED Triage Notes (Signed)
C/O chest pain intermittently. States pain worsens when lifting things, walking up steps, during intercourse.  States pain started about two weeks ago, after being hit by a tree.  States he had picked up a downed tree and part of the tree hit him in the left lower chest.  States chest pain is in his upper right chest.

## 2017-10-27 NOTE — ED Provider Notes (Addendum)
[facility] Emergency Department Provider Note ____________________________________________   First MD Initiated Contact with Patient 10/27/17 1435     (approximate)  I have reviewed the triage vital signs and the nursing notes.   HISTORY  Chief Complaint Chest Pain   HPI Carlos [name] is a 32 y.o. male with history of hypertension, obesity, type 2 diabetes and several "mild heart attacks" who is presenting to the emergency department 2 days of chest pain.  He says it started 2 nights ago when he was hit in the left side of his chest while moving a tree trunk.  However, he says that he now has intermittent tightness across the front of his chest which can last for up to 3 minutes at a time.  He says that he also has shortness of breath and nausea.  He says that the symptoms worsen with exertion and were experienced during sexual activity earlier today which brought him to the hospital.  He denies any symptoms at this time.  Says that his symptoms also worsen with deep breathing.  Says that he has a family history of heart attacks with people in their 30s.  Says that he smokes marijuana daily.   Past Medical History:  Diagnosis Date  . H/O medication noncompliance   . Hyperlipidemia   . Hypertension   . Kidney stones   . Lung nodule   . Nephrolithiasis   . Obesity   . Type 2 diabetes mellitus with complication (HCC) 06/07/2016    Patient Active Problem List   Diagnosis Date Noted  . Submandibular abscess 07/18/2017  . Type 2 diabetes mellitus with diabetic neuropathy, unspecified (HCC) 07/06/2017  . Hyperlipidemia associated with type 2 diabetes mellitus (HCC) 03/30/2017  . Bipolar 2 disorder, major depressive episode (HCC) 03/19/2017  . Cannabis use disorder, moderate, dependence (HCC)   . Chronic posttraumatic stress disorder   . Generalized abdominal pain 01/31/2017  . Depression 01/31/2017  . Stage 3 chronic kidney disease (HCC) 06/15/2016    . H/O medication noncompliance 06/07/2016  . Obesity 06/07/2016  . Erectile dysfunction 06/07/2016  . Type 2 diabetes mellitus with complication (HCC) 06/07/2016  . Hypertensive urgency 06/06/2016  . Chest pain 05/23/2013  . SOB (shortness of breath) 05/23/2013  . Essential hypertension     Past Surgical History:  Procedure Laterality Date  . KIDNEY STONE SURGERY    . TONSILLECTOMY      Prior to Admission medications   Medication Sig Start Date End Date Taking? Authorizing Provider  amLODipine (NORVASC) 10 MG tablet Take 1 tablet (10 mg total) by mouth daily. 07/25/17 10/27/17 Yes Kallie Locks, FNP  divalproex (DEPAKOTE) 500 MG DR tablet Take 1 tablet (500 mg total) by mouth every 12 (twelve) hours. 05/11/17  Yes Doles-Johnson, Teah, NP  gabapentin (NEURONTIN) 300 MG capsule Take 1 capsule (300 mg total) by mouth 2 (two) times daily. 07/06/17  Yes Doles-Johnson, Teah, NP  insulin aspart (NOVOLOG) 100 UNIT/ML injection Use as directed per SSI Novolog Moderate SSI (0-15 units) TID before meals: Less than 70 mg/dl- Drink 4 ounces juice & Recheck CBG in 15 minutes 70-120 mg/dl- 0 units 409-811 mg/dl- 2 units 914-782 mg/dl- 3 units 956-213 mg/dl- 5 units 086-578 mg/dl- 8 units 469-629 mg/dl- 11 units 528-413 mg/dl- 15 units 2/44/01 0/27/25 Yes Kallie Locks, FNP  Insulin Glargine (TOUJEO MAX SOLOSTAR) 300 UNIT/ML SOPN Inject 30 Units into the skin every morning. 07/25/17  Yes Kallie Locks, FNP  lisinopril (PRINIVIL,ZESTRIL) 40  MG tablet Take 1 tablet (40 mg total) by mouth daily. 05/11/17  Yes Doles-Johnson, Teah, NP  metFORMIN (GLUCOPHAGE) 1000 MG tablet Take 1 tablet (1,000 mg total) by mouth daily with breakfast. 05/11/17  Yes Doles-Johnson, Teah, NP  traZODone (DESYREL) 100 MG tablet Take 100 mg by mouth at bedtime.    Yes [provider]  amoxicillin-clavulanate (AUGMENTIN) 875-125 MG tablet Take 1 tablet by mouth 2 (two) times daily. Patient not taking: Reported  on 10/27/2017 07/18/17   Doles-Johnson, Teah, NP  hydrocortisone cream 1 % Apply 1 application topically 2 (two) times daily. Patient not taking: Reported on 07/06/2017 05/11/17   Doles-Johnson, Teah, NP  hydrOXYzine (ATARAX/VISTARIL) 10 MG tablet Take 10 mg by mouth 3 (three) times daily as needed for anxiety (Doesn't know dosage).     [provider]  Insulin Pen Needle 32G X 5 MM MISC 1 each by Does not apply route 3 (three) times daily. 07/25/17   Kallie LocksStroud, Natalie M, FNP  Insulin Syringe 27G X 1/2" 0.5 ML MISC 1 each by Does not apply route 3 (three) times daily. 07/25/17   Kallie LocksStroud, Natalie M, FNP    Allergies Patient has no known allergies.  Family History  Problem Relation Age of Onset  . Heart failure Mother   . Hypertension Mother   . Lung cancer Mother   . Brain cancer Father   . Lung cancer Father   . Diabetes Sister   . Autism Brother     Social History Social History   Tobacco Use  . Smoking status: Former Smoker    Packs/day: 0.50    Years: 1.00    Pack years: 0.50    Types: Cigars    Last attempt to quit: 2016    Years since quitting: 3.6  . Smokeless tobacco: Never Used  Substance Use Topics  . Alcohol use: No    Comment: occasional  . Drug use: Yes    Frequency: 7.0 times per week    Types: Marijuana    Review of Systems  Constitutional: No fever/chills Eyes: No visual changes. ENT: No sore throat. Cardiovascular: As above Respiratory: As above Gastrointestinal: No abdominal pain.   no vomiting.  No diarrhea.  No constipation. Genitourinary: Negative for dysuria. Musculoskeletal: Negative for back pain. Skin: Negative for rash. Neurological: Negative for headaches, focal weakness or numbness.   ____________________________________________   PHYSICAL EXAM:  VITAL SIGNS: ED Triage Vitals  Enc Vitals Group     BP 10/27/17 1418 (!) 176/118     Pulse Rate 10/27/17 1418 97     Resp 10/27/17 1418 16     Temp 10/27/17 1418 98.5 F (36.9 C)       Temp Source 10/27/17 1418 Oral     SpO2 10/27/17 1418 96 %     Weight 10/27/17 1417 284 lb (128.8 kg)     Height 10/27/17 1417 5\' 7"  (1.702 m)     Head Circumference --      Peak Flow --      Pain Score 10/27/17 1416 4     Pain Loc --      Pain Edu? --      Excl. in GC? --     Constitutional: Alert and oriented. Well appearing and in no acute distress. Eyes: Conjunctivae are normal.  Head: Atraumatic. Nose: No congestion/rhinnorhea. Mouth/Throat: Mucous membranes are moist.  Neck: No stridor.   Cardiovascular: Normal rate, regular rhythm. Grossly normal heart sounds.  Good peripheral circulation with equal and  bilateral radial pulses.  Chest pain is not reproducible across the left side of the chest. Respiratory: Normal respiratory effort.  No retractions. Lungs CTAB. Gastrointestinal: Soft and nontender. No distention. Musculoskeletal: No lower extremity tenderness nor edema.  No joint effusions. Neurologic:  Normal speech and language. No gross focal neurologic deficits are appreciated. Skin:  Skin is warm, dry and intact. No rash noted. Psychiatric: Mood and affect are normal. Speech and behavior are normal.  ____________________________________________   LABS (all labs ordered are listed, but only abnormal results are displayed)  Labs Reviewed  BASIC METABOLIC PANEL - Abnormal; Notable for the following components:      Result Value   Glucose, Bld 279 (*)    BUN 21 (*)    Creatinine, Ser 1.46 (*)    All other components within normal limits  CBC - Abnormal; Notable for the following components:   HCT 37.9 (*)    All other components within normal limits  TROPONIN I  FIBRIN DERIVATIVES D-DIMER ([facility] ONLY)  TROPONIN I  TROPONIN I   ____________________________________________  EKG  ED ECG REPORT I, Arelia Longestavid M Schaevitz, the attending physician, personally viewed and interpreted this ECG.   Date: 10/27/2017  EKG Time: 1419  Rate: 100  Rhythm: normal sinus  rhythm  Axis: Normal  Intervals:none  ST&T Change: No ST segment elevation or depression.  No abnormal T wave inversion.  ____________________________________________  RADIOLOGY  No active cardiopulmonary disease. ____________________________________________   PROCEDURES  Procedure(s) performed:   Procedures  Critical Care performed:   ____________________________________________   INITIAL IMPRESSION / ASSESSMENT AND PLAN / ED COURSE  Pertinent labs & imaging results that were available during my care of the patient were reviewed by me and considered in my medical decision making (see chart for details).  Differential includes, but is not limited to, viral syndrome, bronchitis including COPD exacerbation, pneumonia, reactive airway disease including asthma, CHF including exacerbation with or without pulmonary/interstitial edema, pneumothorax, ACS, thoracic trauma, and pulmonary embolism. As part of my medical decision making, I reviewed the following data within the electronic MEDICAL RECORD NUMBER Notes from prior ED visits  Patient also with hypertension but says that he took amlodipine as well as lisinopril just prior to arrival.  ----------------------------------------- 6:18 PM on 10/27/2017 -----------------------------------------  Patient at this time without any chest pain.  I recommended admission for this patient.  However, he is refusing.  He states that he becomes very anxious and hospitals and he does not want to stay.  He says that this pain is different from his heart attack pain in the past.  He appears clinically sober and has good insight into his disease course and is aware of the possibility of death or permanent disability if he goes home.  He is aware that he can and must return immediately for any worsening or concerning symptoms and that otherwise if he does not return to the emergency department that he call first thing Monday morning to follow-up with his  cardiologist.  Despite his complaints his EKG has been reassuring and he has 2 - troponins.  D-dimer also negative.  Patient's blood pressure still elevated but he states that this is common for him when he is in the hospital. ____________________________________________   FINAL CLINICAL IMPRESSION(S) / ED DIAGNOSES  Chest pain.  NEW MEDICATIONS STARTED DURING THIS VISIT:  New Prescriptions   No medications on file     Note:  This document was prepared using Dragon voice recognition software and may include  unintentional dictation errors.     Myrna Blazer, MD 10/27/17 1821  Patient requesting work note.    Myrna Blazer, MD 10/27/17 [phone]

## 2017-12-18 ENCOUNTER — Telehealth: Payer: Self-pay | Admitting: Pharmacist

## 2017-12-18 NOTE — Telephone Encounter (Signed)
12/18/2017 10:14:48 AM - Nathen May Max Solostar pen refill  12/18/17 Faxed [employer] refill request for refill on [employer] Inject 30 units into the skin every morning #3.Forde Radon

## 2018-05-07 ENCOUNTER — Telehealth: Payer: Self-pay | Admitting: Pharmacist

## 2018-05-07 NOTE — Telephone Encounter (Signed)
05/07/2018 10:13:27 AM - Nathen May Solostar [facility] [employer]  05/07/2018 I have in [employer] dated 12/20/2017 where we received 1 box (2pens) Toujeo Solostar--note on invoice [facility] for non pick up.Forde Radon

## 2018-05-24 ENCOUNTER — Ambulatory Visit: Payer: Self-pay | Admitting: Ophthalmology

## 2018-10-24 ENCOUNTER — Telehealth: Payer: Self-pay | Admitting: Pharmacy Technician

## 2018-10-24 NOTE — Telephone Encounter (Signed)
Patient failed to provide2020 financial documentation. No additional medication assistance will be provided by [facility] without the required proof of income documentation. Patient notified by letter.  Camelle Henkels, CPhT [facility] 

## 2018-12-03 ENCOUNTER — Emergency Department
Admission: EM | Admit: 2018-12-03 | Discharge: 2018-12-03 | Disposition: A | Discharge status: Home / Self Care | Payer: Medicaid Other | Attending: Emergency Medicine | Admitting: Emergency Medicine

## 2018-12-03 ENCOUNTER — Encounter: Payer: Self-pay | Admitting: Emergency Medicine

## 2018-12-03 ENCOUNTER — Other Ambulatory Visit: Payer: Self-pay

## 2018-12-03 ENCOUNTER — Emergency Department: Payer: Medicaid Other

## 2018-12-03 DIAGNOSIS — W208XXA Other cause of strike by thrown, projected or falling object, initial encounter: Secondary | ICD-10-CM | POA: Insufficient documentation

## 2018-12-03 DIAGNOSIS — N183 Chronic kidney disease, stage 3 (moderate): Secondary | ICD-10-CM | POA: Insufficient documentation

## 2018-12-03 DIAGNOSIS — Y929 Unspecified place or not applicable: Secondary | ICD-10-CM | POA: Insufficient documentation

## 2018-12-03 DIAGNOSIS — Z79899 Other long term (current) drug therapy: Secondary | ICD-10-CM | POA: Insufficient documentation

## 2018-12-03 DIAGNOSIS — Y939 Activity, unspecified: Secondary | ICD-10-CM | POA: Insufficient documentation

## 2018-12-03 DIAGNOSIS — E1122 Type 2 diabetes mellitus with diabetic chronic kidney disease: Secondary | ICD-10-CM | POA: Insufficient documentation

## 2018-12-03 DIAGNOSIS — Y999 Unspecified external cause status: Secondary | ICD-10-CM | POA: Insufficient documentation

## 2018-12-03 DIAGNOSIS — Z87891 Personal history of nicotine dependence: Secondary | ICD-10-CM | POA: Insufficient documentation

## 2018-12-03 DIAGNOSIS — S62336A Displaced fracture of neck of fifth metacarpal bone, right hand, initial encounter for closed fracture: Secondary | ICD-10-CM | POA: Insufficient documentation

## 2018-12-03 DIAGNOSIS — I129 Hypertensive chronic kidney disease with stage 1 through stage 4 chronic kidney disease, or unspecified chronic kidney disease: Secondary | ICD-10-CM | POA: Insufficient documentation

## 2018-12-03 DIAGNOSIS — Z794 Long term (current) use of insulin: Secondary | ICD-10-CM | POA: Insufficient documentation

## 2018-12-03 DIAGNOSIS — F1210 Cannabis abuse, uncomplicated: Secondary | ICD-10-CM | POA: Insufficient documentation

## 2018-12-03 MED ORDER — HYDROCODONE-ACETAMINOPHEN 5-325 MG PO TABS: 1.0000 | ORAL_TABLET | ORAL | 0 refills | Status: AC | PRN

## 2018-12-03 MED ORDER — MELOXICAM 15 MG PO TABS: 15.0000 mg | ORAL_TABLET | Freq: Every day | ORAL | 0 refills | Status: AC

## 2018-12-03 NOTE — ED Provider Notes (Signed)
[facility] Emergency Department Provider Note  ____________________________________________  Time seen: Approximately 6:03 PM  I have reviewed the triage vital signs and the nursing notes.   HISTORY  Chief Complaint Hand Injury    HPI Carlos [name] is a 33 y.o. male who presents the emergency department for evaluation of right hand injury.  Patient reportedly dropped a box on his right hand causing an injury.  This is been ongoing times several days.  Patient reports his wife made him come to the emergency department for evaluation.  Patient has pain to the fourth and fifth metacarpal region.  No loss of range of motion to the digits.  No radicular symptoms.  No other injury or complaint.         Past Medical History:  Diagnosis Date  . H/O medication noncompliance   . Hyperlipidemia   . Hypertension   . Kidney stones   . Lung nodule   . Nephrolithiasis   . Obesity   . Type 2 diabetes mellitus with complication (HCC) 06/07/2016    Patient Active Problem List   Diagnosis Date Noted  . Submandibular abscess 07/18/2017  . Type 2 diabetes mellitus with diabetic neuropathy, unspecified (HCC) 07/06/2017  . Hyperlipidemia associated with type 2 diabetes mellitus (HCC) 03/30/2017  . Bipolar 2 disorder, major depressive episode (HCC) 03/19/2017  . Cannabis use disorder, moderate, dependence (HCC)   . Chronic posttraumatic stress disorder   . Generalized abdominal pain 01/31/2017  . Depression 01/31/2017  . Stage 3 chronic kidney disease (HCC) 06/15/2016  . H/O medication noncompliance 06/07/2016  . Obesity 06/07/2016  . Erectile dysfunction 06/07/2016  . Type 2 diabetes mellitus with complication (HCC) 06/07/2016  . Hypertensive urgency 06/06/2016  . Chest pain 05/23/2013  . SOB (shortness of breath) 05/23/2013  . Essential hypertension     Past Surgical History:  Procedure Laterality Date  . KIDNEY STONE SURGERY    . TONSILLECTOMY       Prior to Admission medications   Medication Sig Start Date End Date Taking? Authorizing Provider  amLODipine (NORVASC) 10 MG tablet Take 1 tablet (10 mg total) by mouth daily. 07/25/17 10/27/17  Kallie Locks, FNP  divalproex (DEPAKOTE) 500 MG DR tablet Take 1 tablet (500 mg total) by mouth every 12 (twelve) hours. 05/11/17   Doles-Johnson, Teah, NP  gabapentin (NEURONTIN) 300 MG capsule Take 1 capsule (300 mg total) by mouth 2 (two) times daily. 07/06/17   Doles-Johnson, Teah, NP  HYDROcodone-acetaminophen (NORCO/VICODIN) 5-325 MG tablet Take 1 tablet by mouth every 4 (four) hours as needed for moderate pain. 12/03/18   Cuthriell, Delorise Royals, PA-C  hydrOXYzine (ATARAX/VISTARIL) 10 MG tablet Take 10 mg by mouth 3 (three) times daily as needed for anxiety (Doesn't know dosage).     [provider]  insulin aspart (NOVOLOG) 100 UNIT/ML injection Use as directed per SSI Novolog Moderate SSI (0-15 units) TID before meals: Less than 70 mg/dl- Drink 4 ounces juice & Recheck CBG in 15 minutes 70-120 mg/dl- 0 units 226-333 mg/dl- 2 units 545-625 mg/dl- 3 units 638-937 mg/dl- 5 units 342-876 mg/dl- 8 units 811-572 mg/dl- 11 units 620-355 mg/dl- 15 units 9/74/16 3/84/53  Kallie Locks, FNP  Insulin Glargine (TOUJEO MAX SOLOSTAR) 300 UNIT/ML SOPN Inject 30 Units into the skin every morning. 07/25/17   Kallie Locks, FNP  Insulin Pen Needle 32G X 5 MM MISC 1 each by Does not apply route 3 (three) times daily. 07/25/17   Kallie Locks,  FNP  Insulin Syringe 27G X 1/2" 0.5 ML MISC 1 each by Does not apply route 3 (three) times daily. 07/25/17   Azzie Glatter, FNP  lisinopril (PRINIVIL,ZESTRIL) 40 MG tablet Take 1 tablet (40 mg total) by mouth daily. 05/11/17   Doles-Johnson, Teah, NP  meloxicam (MOBIC) 15 MG tablet Take 1 tablet (15 mg total) by mouth daily. 12/03/18   Cuthriell, Charline Bills, PA-C  metFORMIN (GLUCOPHAGE) 1000 MG tablet Take 1 tablet (1,000 mg total) by mouth daily with  breakfast. 05/11/17   Doles-Johnson, Teah, NP  traZODone (DESYREL) 100 MG tablet Take 100 mg by mouth at bedtime.     [provider]    Allergies Patient has no known allergies.  Family History  Problem Relation Age of Onset  . Heart failure Mother   . Hypertension Mother   . Lung cancer Mother   . Brain cancer Father   . Lung cancer Father   . Diabetes Sister   . Autism Brother     Social History Social History   Tobacco Use  . Smoking status: Former Smoker    Packs/day: 0.50    Years: 1.00    Pack years: 0.50    Types: Cigars    Quit date: 2016    Years since quitting: 4.7  . Smokeless tobacco: Never Used  Substance Use Topics  . Alcohol use: No    Comment: occasional  . Drug use: Yes    Frequency: 7.0 times per week    Types: Marijuana     Review of Systems  Constitutional: No fever/chills Eyes: No visual changes. No discharge ENT: No upper respiratory complaints. Cardiovascular: no chest pain. Respiratory: no cough. No SOB. Gastrointestinal: No abdominal pain.  No nausea, no vomiting.  No diarrhea.  No constipation. Musculoskeletal: Positive for right hand injury/pain Skin: Negative for rash, abrasions, lacerations, ecchymosis. Neurological: Negative for headaches, focal weakness or numbness. 10-point ROS otherwise negative.  ____________________________________________   PHYSICAL EXAM:  VITAL SIGNS: ED Triage Vitals [12/03/18 1757]  Enc Vitals Group     BP      Pulse      Resp      Temp      Temp src      SpO2      Weight 282 lb (127.9 kg)     Height 5\' 7"  (1.702 m)     Head Circumference      Peak Flow      Pain Score 7     Pain Loc      Pain Edu?      Excl. in [place]?      Constitutional: Alert and oriented. Well appearing and in no acute distress. Eyes: Conjunctivae are normal. PERRL. EOMI. Head: Atraumatic. ENT:      Ears:       Nose: No congestion/rhinnorhea.      Mouth/Throat: Mucous membranes are moist.  Neck: No  stridor.    Cardiovascular: Normal rate, regular rhythm. Normal S1 and S2.  Good peripheral circulation. Respiratory: Normal respiratory effort without tachypnea or retractions. Lungs CTAB. Good air entry to the bases with no decreased or absent breath sounds. Musculoskeletal: Full range of motion to all extremities. No gross deformities appreciated.  Visualization of the right hand reveals mild to moderate edema along the dorsal aspect of the hand over the fourth and fifth metacarpal.  Patient is able to extend and flex all 5 digits.  Patient is very tender to palpation with possible palpable abnormality along  the fifth metacarpal.  Capillary refill less than 2 seconds all digits.  No other visible abnormality to the right hand.  No other tenderness or palpable abnormality about the right hand. Neurologic:  Normal speech and language. No gross focal neurologic deficits are appreciated.  Skin:  Skin is warm, dry and intact. No rash noted. Psychiatric: Mood and affect are normal. Speech and behavior are normal. Patient exhibits appropriate insight and judgement.   ____________________________________________   LABS (all labs ordered are listed, but only abnormal results are displayed)  Labs Reviewed - No data to display ____________________________________________  EKG   ____________________________________________  RADIOLOGY I personally viewed and evaluated these images as part of my medical decision making, as well as reviewing the written report by the radiologist.  Dg Hand Complete Right  Result Date: 12/03/2018 CLINICAL DATA:  Hand pain and swelling EXAM: RIGHT HAND - COMPLETE 3+ VIEW COMPARISON:  None. FINDINGS: Acute comminuted fracture of the fifth metacarpal neck slight dorsal apex angulation. Fracture appears extra-articular. No dislocation. Soft tissue swelling at the fracture site. Remaining osseous structures of the hand appear intact without additional fractures. Joint spaces  are maintained. IMPRESSION: Acute mildly angulated fracture of the fifth metacarpal neck. Electronically Signed   By: Duanne GuessNicholas  Plundo M.D.   On: 12/03/2018 18:37    ____________________________________________    PROCEDURES  Procedure(s) performed:    .Splint Application  Date/Time: 12/03/2018 7:07 PM Performed by: Racheal Patchesuthriell, Jonathan D, PA-C Authorized by: Racheal Patchesuthriell, Jonathan D, PA-C   Consent:    Consent obtained:  Verbal   Consent given by:  Patient   Risks discussed:  Pain and swelling Pre-procedure details:    Sensation:  Normal Procedure details:    Laterality:  Right   Location:  Hand   Hand:  R hand   Splint type:  Ulnar gutter   Supplies:  Cotton padding, Ortho-Glass and elastic bandage Post-procedure details:    Pain:  Improved   Sensation:  Normal   Patient tolerance of procedure:  Tolerated well, no immediate complications      Medications - No data to display   ____________________________________________   INITIAL IMPRESSION / ASSESSMENT AND PLAN / ED COURSE  Pertinent labs & imaging results that were available during my care of the patient were reviewed by me and considered in my medical decision making (see chart for details).  Review of the [place] CSRS was performed in accordance of the [facility] prior to dispensing any controlled drugs.           Patient's diagnosis is consistent with fifth metacarpal fracture.  Patient presented to the emergency department with an injury to the right hand.  Patient states that the injury occurred several days ago.  Ongoing edema and pain.  X-ray reveals fifth metacarpal fracture.  Patient will be placed in ulnar gutter splint.  Follow-up with orthopedics.  Patient will be prescribed anti-inflammatory and pain medication..Patient is given ED precautions to return to the ED for any worsening or new symptoms.     ____________________________________________  FINAL CLINICAL IMPRESSION(S) / ED DIAGNOSES  Final  diagnoses:  Closed displaced fracture of neck of fifth metacarpal bone of right hand, initial encounter      NEW MEDICATIONS STARTED DURING THIS VISIT:  ED Discharge Orders         Ordered    HYDROcodone-acetaminophen (NORCO/VICODIN) 5-325 MG tablet  Every 4 hours PRN     12/03/18 1907    meloxicam (MOBIC) 15 MG tablet  Daily  12/03/18 1907              This chart was dictated using voice recognition software/Dragon. Despite best efforts to proofread, errors can occur which can change the meaning. Any change was purely unintentional.    Racheal PatchesCuthriell, Jonathan D, PA-C 12/03/18 Abram [id number]    Isaacs, Cameron, MD 12/04/18 [phone]

## 2018-12-03 NOTE — ED Notes (Signed)
See triage note  States he was moving heavy boxes couple of days ago  And the box dropped on right hand  Swelling noted  Good pulses

## 2018-12-03 NOTE — ED Triage Notes (Signed)
FIRST NURSE NOTE- right hand pain and swelling. Ambulatory. NAD

## 2018-12-03 NOTE — ED Triage Notes (Signed)
Patient reports box fell on right hand two days ago.  Swelling present to lateral aspect of posterior right hand.

## 2020-02-24 IMAGING — CR DG CHEST 2V
1 series · 2 of 2 positions shown · non-contrast
Comparison: 06/05/2016

CLINICAL DATA: Intermittent chest pain which shortness-of-breath.

EXAM:
CHEST - 2 VIEW

[Series 1: dg chest 2 view · 0.14mm/px · 2 of 2 slices shown]
[im 1/2]
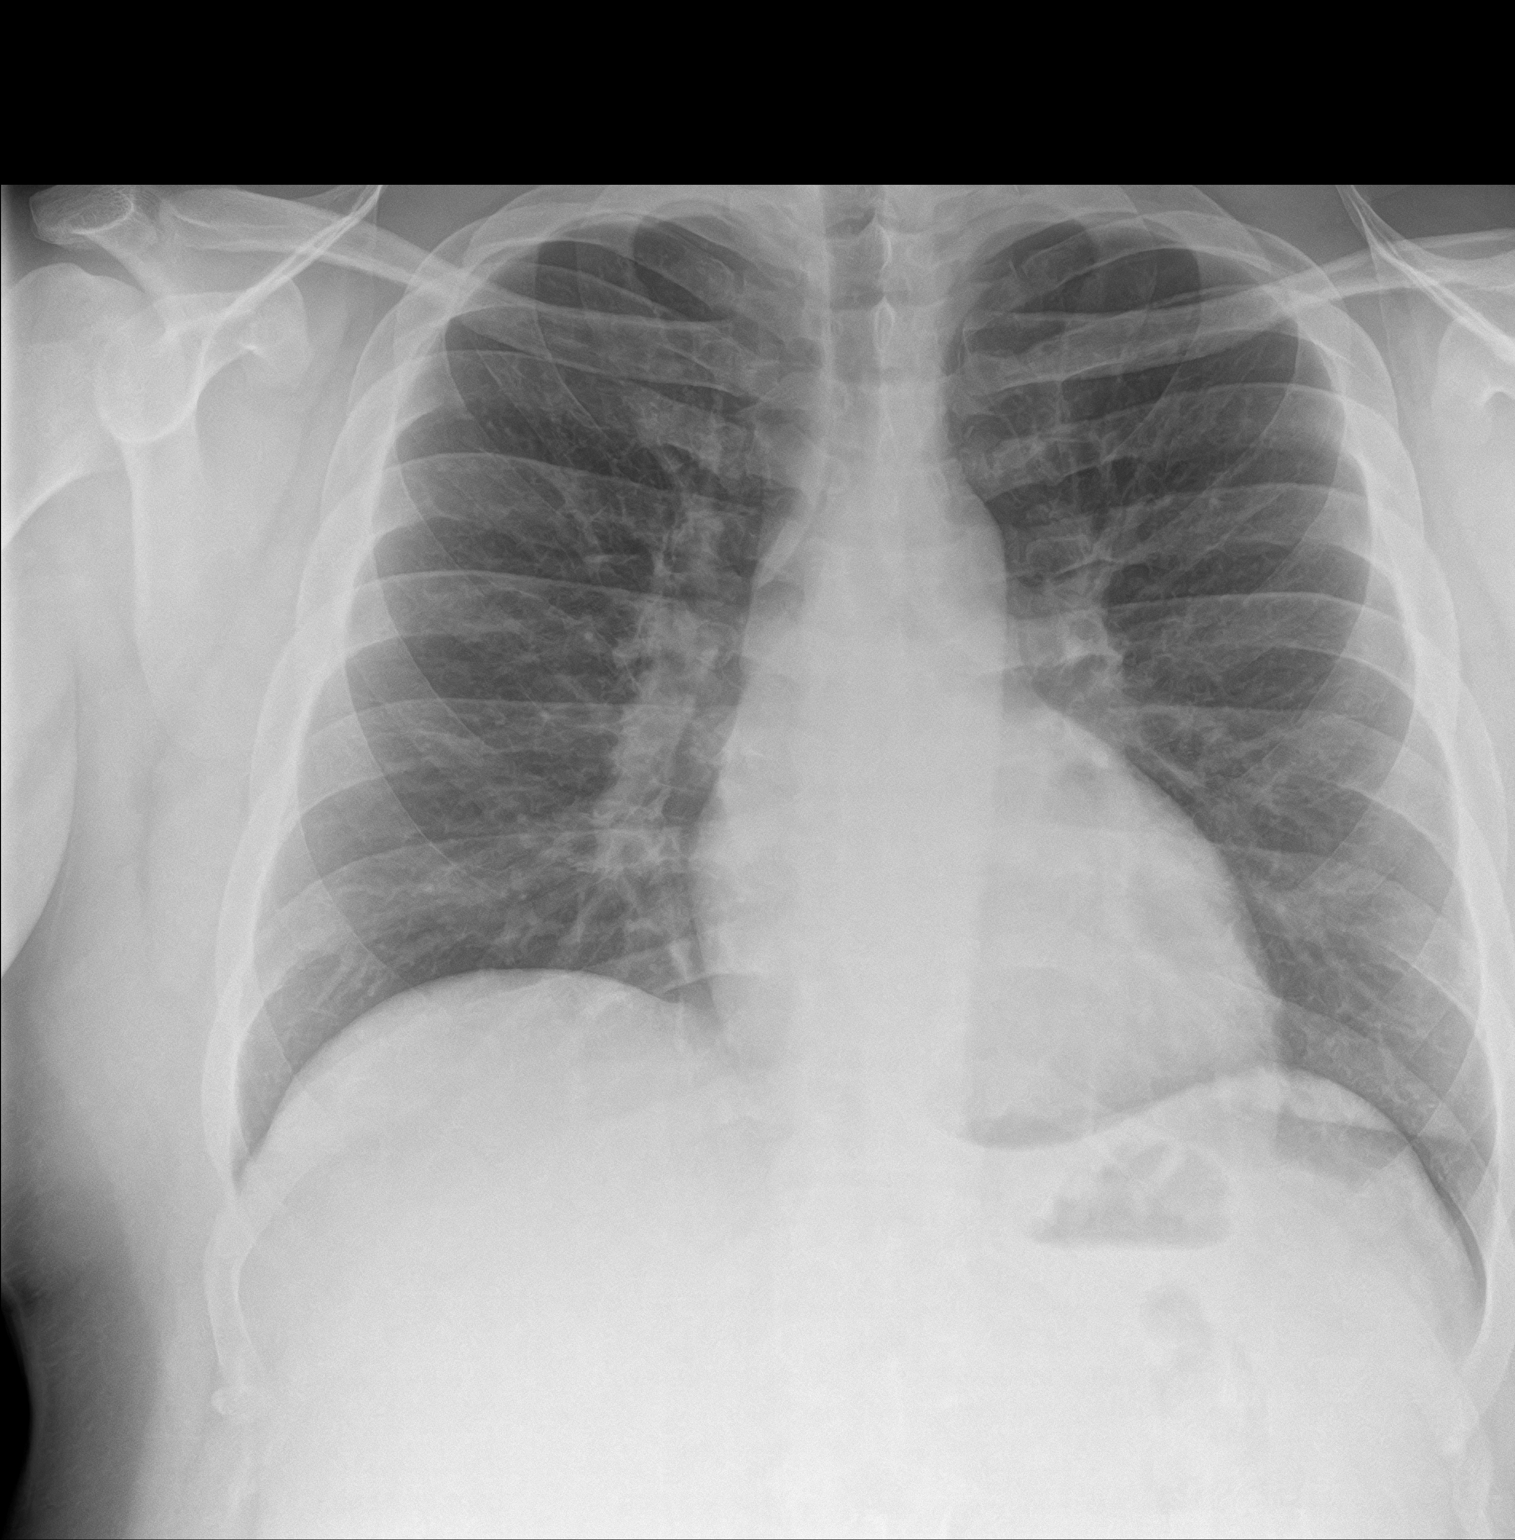
[im 2/2]
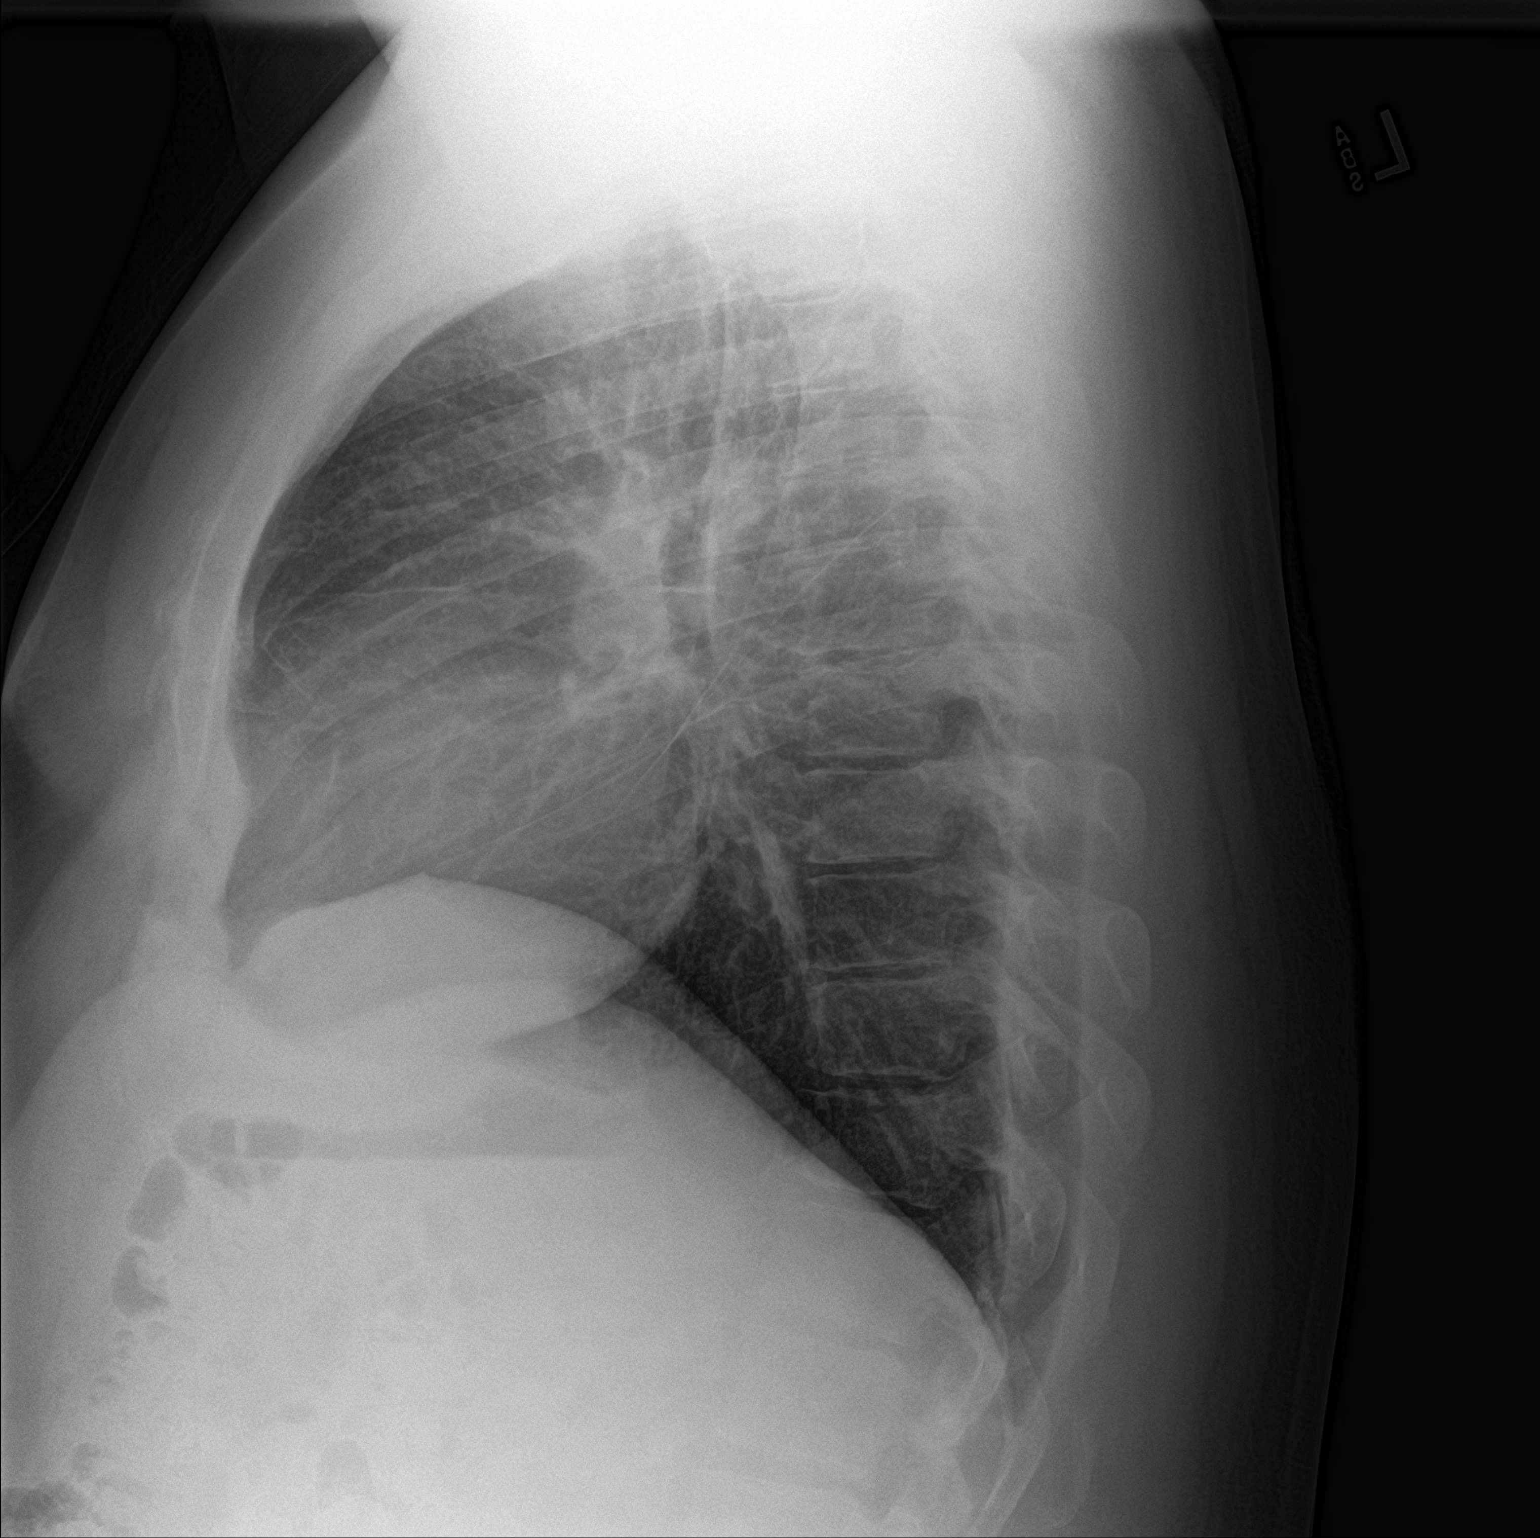

[2 of 2 positions shown; findings below may reference images not displayed]

FINDINGS: Lungs are clear. Cardiomediastinal silhouette and remainder the exam
is unchanged.
IMPRESSION: No active cardiopulmonary disease.

## 2021-04-01 IMAGING — DX DG HAND COMPLETE 3+V*R*
3 series · 3 of 3 positions shown · non-contrast
Comparison: None.

CLINICAL DATA: Hand pain and swelling

EXAM:
RIGHT HAND - COMPLETE 3+ VIEW

[hand ap]
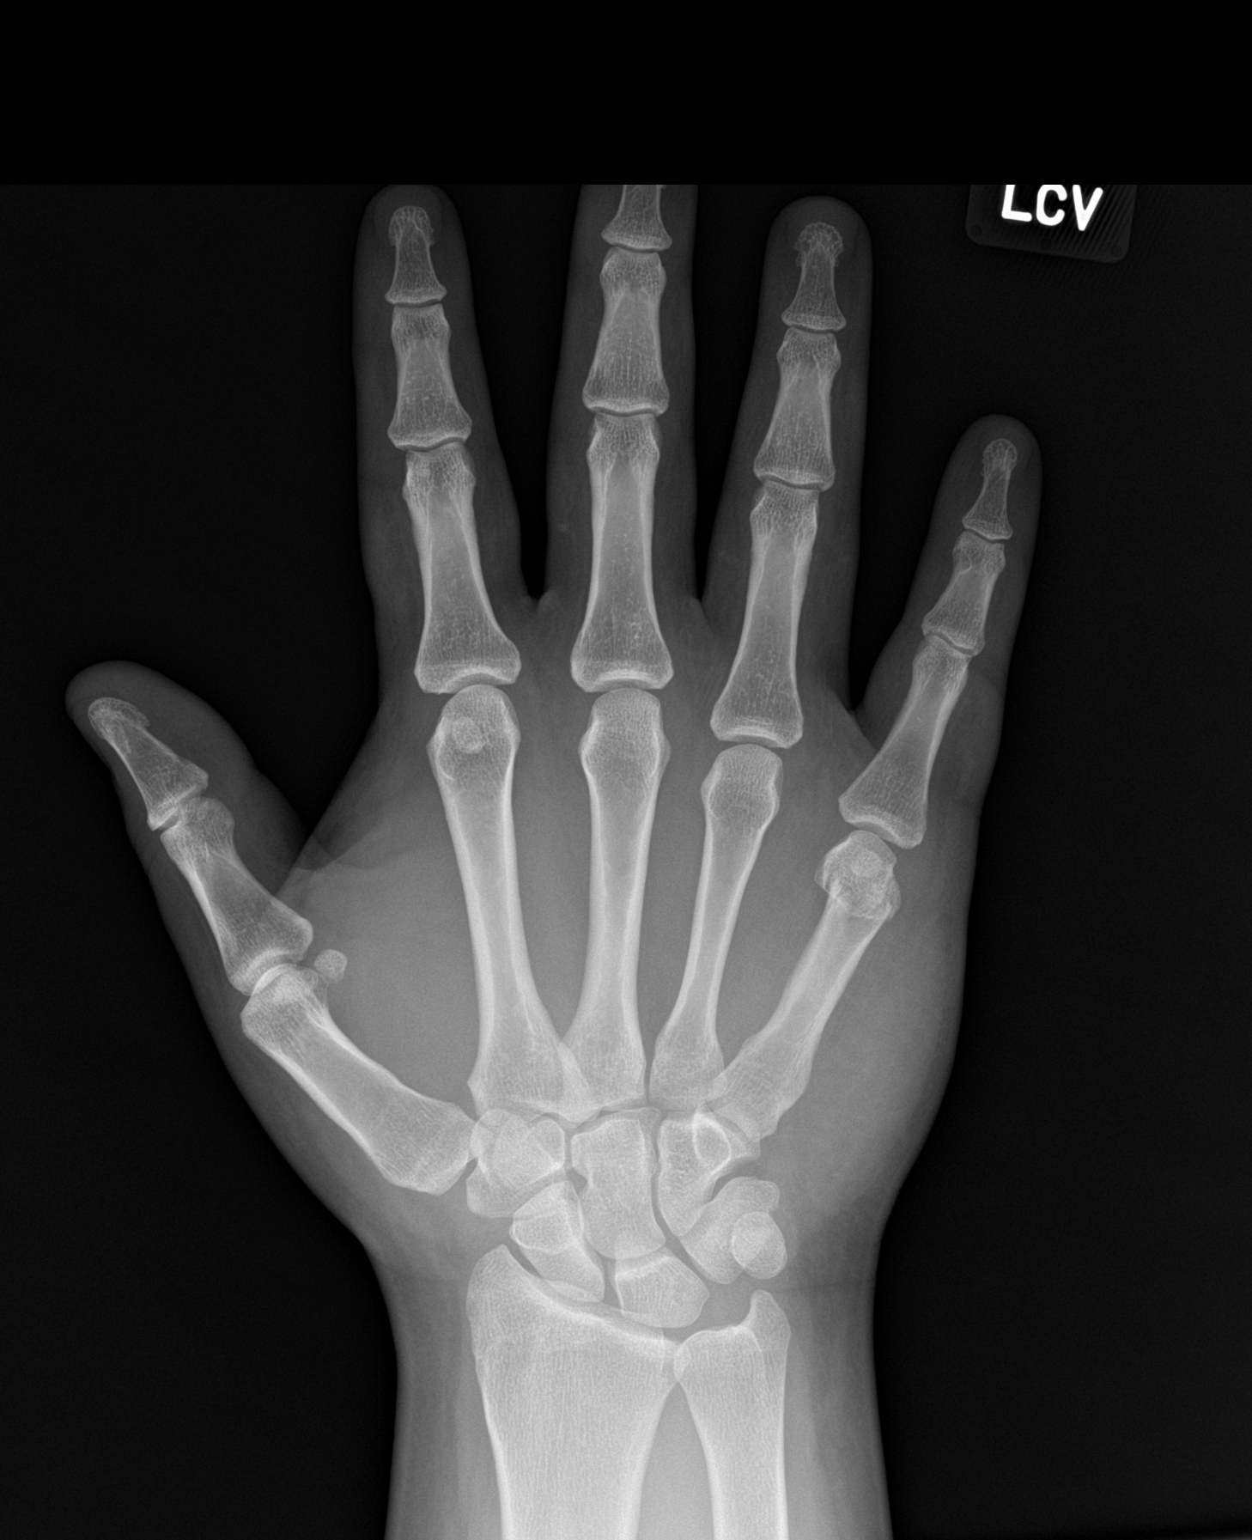

[hand obl]
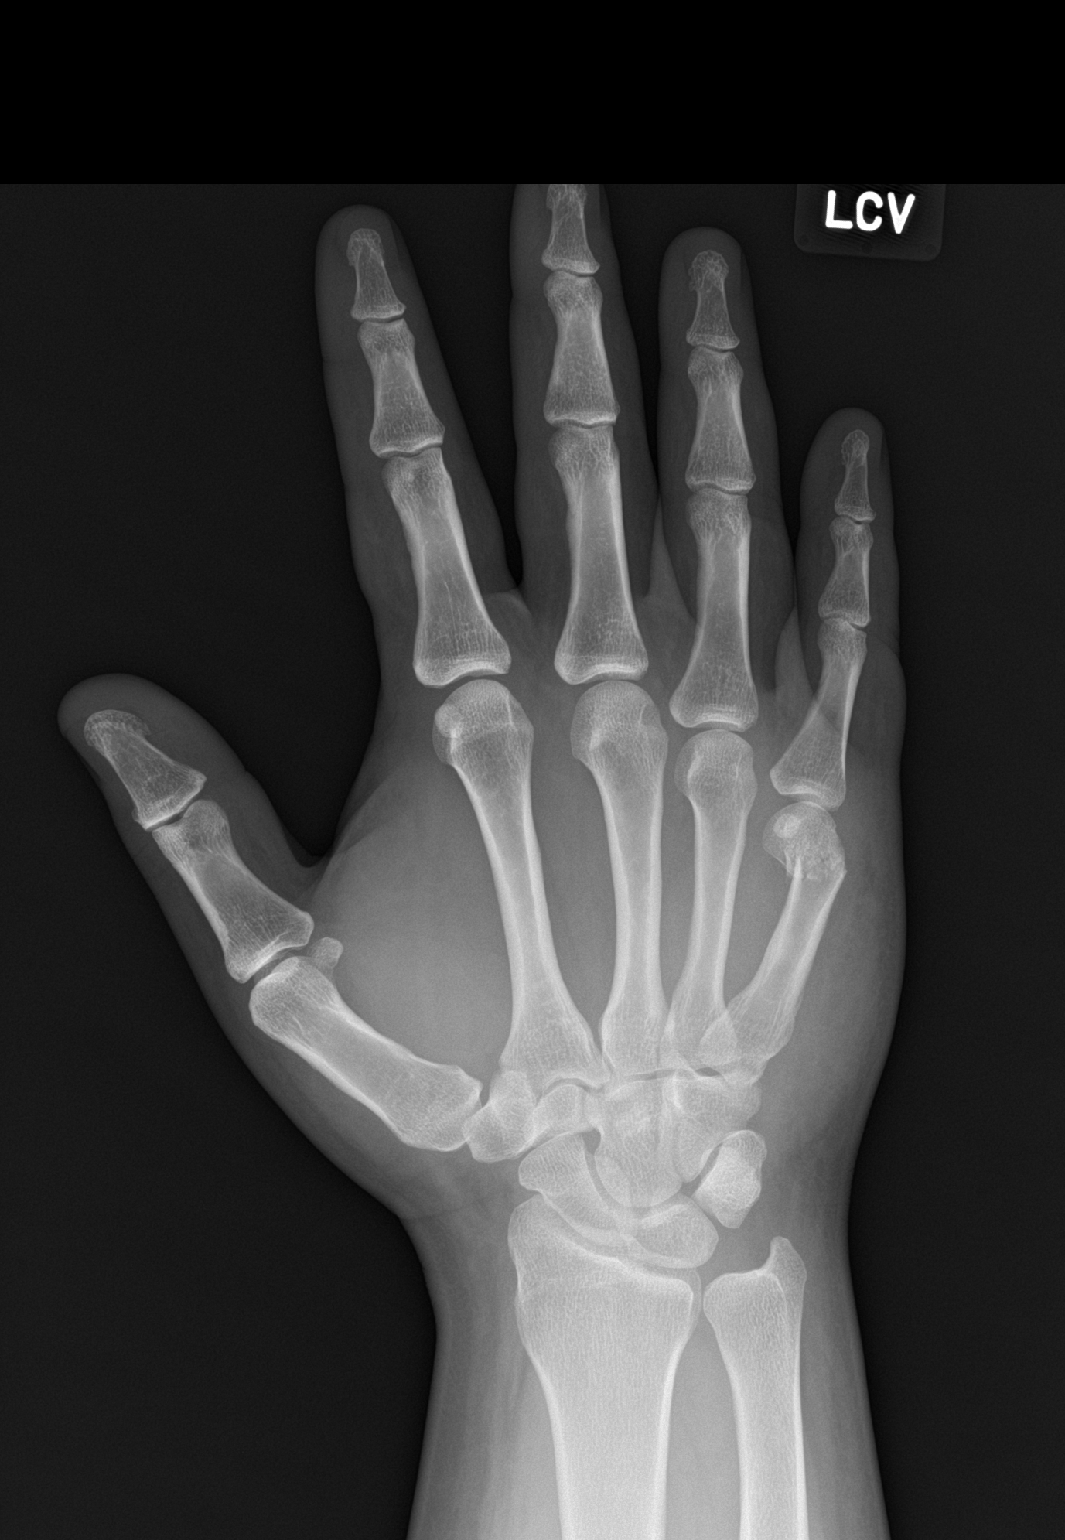

[hand lat]
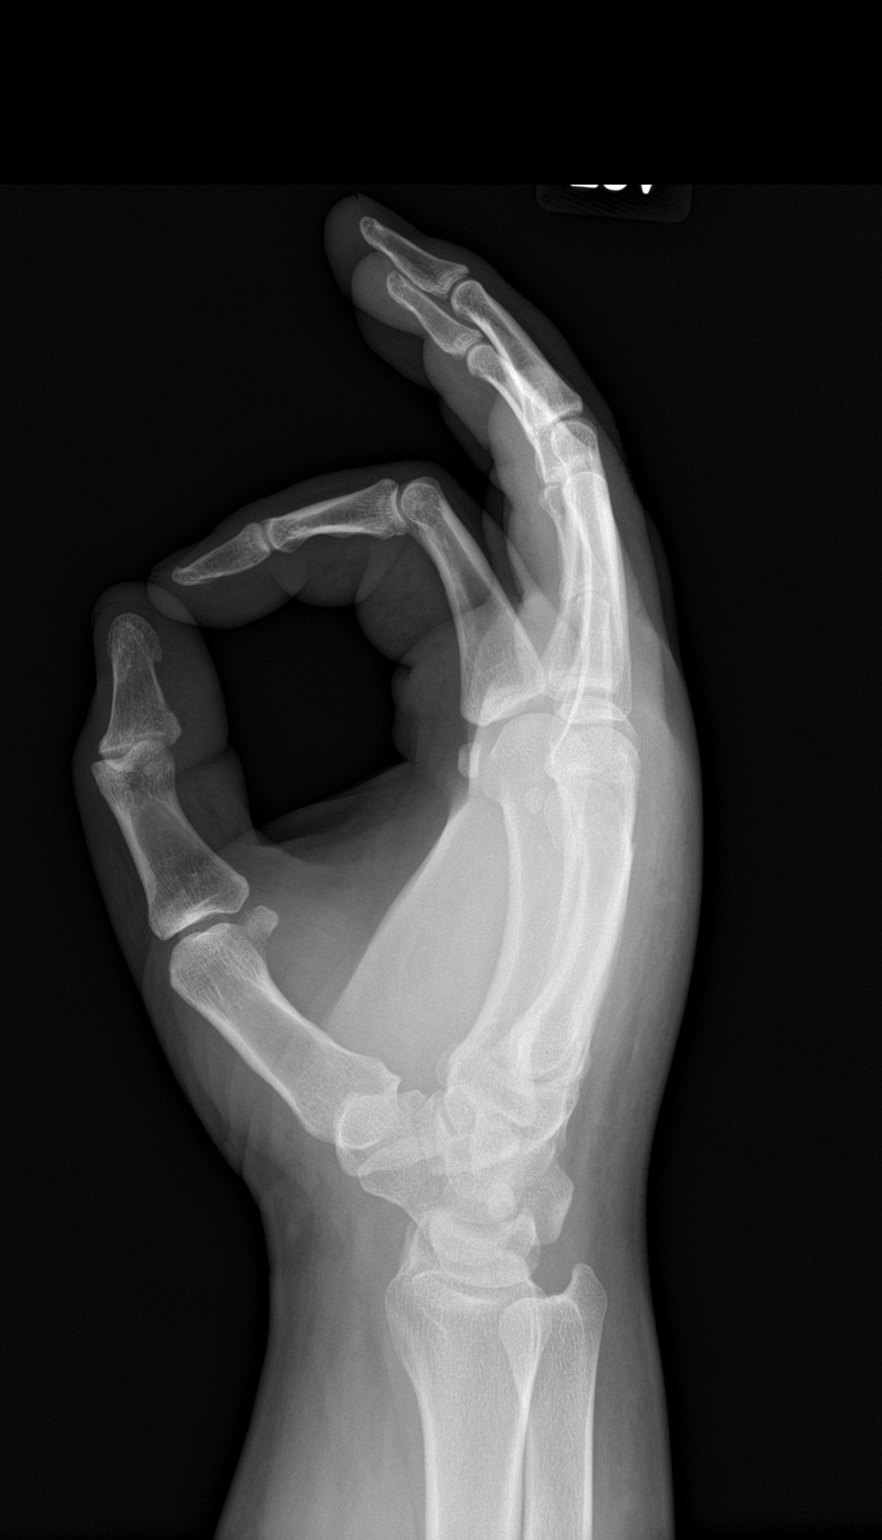

[3 of 3 positions shown; findings below may reference images not displayed]

FINDINGS: Acute comminuted fracture of the fifth metacarpal neck slight dorsal
apex angulation. Fracture appears extra-articular. No dislocation.
Soft tissue swelling at the fracture site. Remaining osseous
structures of the hand appear intact without additional fractures.
Joint spaces are maintained.
IMPRESSION: Acute mildly angulated fracture of the fifth metacarpal neck.
